# Patient Record
Sex: Male | Born: 1990 | Race: White | Hispanic: No | Marital: Single | Smoking: Never smoker
Health system: Southern US, Community
[De-identification: ages and names within clinical notes are randomized; demographics above are authoritative.]

## PROBLEM LIST (undated history)

## (undated) HISTORY — PX: HAND SURGERY: SHX662

## (undated) HISTORY — PX: TOE SURGERY: SHX1073

---
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
## 2011-05-30 ENCOUNTER — Emergency Department (HOSPITAL_BASED_OUTPATIENT_CLINIC_OR_DEPARTMENT_OTHER)
Admission: EM | Admit: 2011-05-30 | Discharge: 2011-05-30 | Disposition: A | Discharge status: Home / Self Care | Payer: Medicaid Other | Attending: Emergency Medicine | Admitting: Emergency Medicine

## 2011-05-30 ENCOUNTER — Other Ambulatory Visit: Payer: Self-pay

## 2011-05-30 ENCOUNTER — Encounter: Payer: Self-pay | Admitting: *Deleted

## 2011-05-30 ENCOUNTER — Emergency Department (INDEPENDENT_AMBULATORY_CARE_PROVIDER_SITE_OTHER): Payer: Medicaid Other

## 2011-05-30 DIAGNOSIS — R079 Chest pain, unspecified: Secondary | ICD-10-CM

## 2011-05-30 DIAGNOSIS — R0602 Shortness of breath: Secondary | ICD-10-CM | POA: Insufficient documentation

## 2011-05-30 MED ORDER — IBUPROFEN 800 MG PO TABS: 800.0000 mg | ORAL_TABLET | Freq: Three times a day (TID) | ORAL | Status: AC

## 2011-05-30 NOTE — ED Provider Notes (Signed)
History     CSN: [id number] Arrival date & time: 05/30/2011  6:49 PM  Chief Complaint  Patient presents with  . Influenza    (Consider location/radiation/quality/duration/timing/severity/associated sxs/prior treatment) HPI History provided by patient and his mother.  Pt has had diffuse body aches since yesterday afternoon.   Developed pain across top of chest w/ associated SOB and intermittent lightheadedness yesterday evening while pushing carts at work.  Symptoms lasted for approx 2 hours.  Mildly alleviated by sitting and drinking water.  Had lightheadedness this am but no CP.   No fever, cough, nasal congestion, rhinorrhea, sore throat, ear pain, abd pain, N/V/D.   Pt does not smoke cigarettes.  No FH of early MI.  No risk factors for PE.  No known sick contacts.   History reviewed. No pertinent past medical history.  Past Surgical History  Procedure Date  . Hand surgery   . Toe surgery     History reviewed. No pertinent family history.  History  Substance Use Topics  . Smoking status: Never Smoker   . Smokeless tobacco: Not on file  . Alcohol Use: No      Review of Systems  All other systems reviewed and are negative.    Allergies  Review of patient's allergies indicates no known allergies.  Home Medications   Current Outpatient Rx  Name Route Sig Dispense Refill  . IBUPROFEN 200 MG PO TABS Oral Take 600 mg by mouth once as needed. For pain       BP 123/74  Pulse 88  Temp(Src) 98.5 F (36.9 C) (Oral)  Resp 17  Ht 6\' 2"  (1.88 m)  Wt 205 lb (92.987 kg)  BMI 26.32 kg/m2  SpO2 100%  Physical Exam  Nursing note and vitals reviewed. Constitutional: He is oriented to person, place, and time. He appears well-developed and well-nourished. No distress.  HENT:  Head: Normocephalic and atraumatic.  Eyes:       Normal appearance  Neck: Normal range of motion.  Cardiovascular: Normal rate and regular rhythm.   Pulmonary/Chest: Effort normal and breath sounds  normal. He exhibits no tenderness.  Abdominal: Soft. Bowel sounds are normal. He exhibits no distension. There is no tenderness.  Musculoskeletal: He exhibits no edema and no tenderness.  Neurological: He is alert and oriented to person, place, and time.  Skin: Skin is warm and dry. No rash noted.  Psychiatric: He has a normal mood and affect. His behavior is normal.    Date: 05/31/2011  Rate: 63    Rhythm: normal sinus rhythm and sinus arrhythmia  QRS Axis: normal  Intervals: normal  ST/T Wave abnormalities: normal  Conduction Disutrbances:none  Narrative Interpretation:   Old EKG Reviewed: none available    ED Course  Procedures (including critical care time)  Labs Reviewed - No data to display Dg Chest 2 View  05/30/2011  *RADIOLOGY REPORT*  Clinical Data: Pain.  Flu-like symptoms.  Nonsmoker.  CHEST - 2 VIEW  Comparison: None.  Findings: The heart size and pulmonary vascularity are normal. The lungs appear clear and expanded without focal air space disease or consolidation. No blunting of the costophrenic angles.  No pneumothorax.  IMPRESSION: No evidence of active pulmonary disease.  Original Report Authenticated By: Marlon Pel, M.D.     1. Chest pain       MDM  Healthy Vernona Rieger presents w/ 2 hours of CP/SOB yesterday evening while pushing carts at work. No RF for ACS; no RF for or exam findings  consistent w/ PE.  Pain is not reproducible on exam.  EKG w/out signs of ischemia.  CXR neg.  Pt and his mother reassured.  Return precautions discussed.  Discharged home w/ ibuprofen.         Otilio Miu, PA 05/30/11 2335  Otilio Miu, PA 05/31/11 0004

## 2011-05-30 NOTE — ED Notes (Signed)
Pt states he has had body aches since yesterday. Some chest discomfort yesterday, none now. Runny nose.

## 2011-05-30 NOTE — ED Notes (Signed)
D/c home- no Rx given 

## 2011-06-01 NOTE — ED Provider Notes (Signed)
Medical screening examination/treatment/procedure(s) were performed by non-physician practitioner and as supervising physician I was immediately available for consultation/collaboration.   Geoffery Lyons, MD 06/01/11 1325

## 2011-08-11 ENCOUNTER — Emergency Department (HOSPITAL_BASED_OUTPATIENT_CLINIC_OR_DEPARTMENT_OTHER)
Admission: EM | Admit: 2011-08-11 | Discharge: 2011-08-12 | Disposition: A | Discharge status: Home / Self Care | Payer: Medicaid Other | Attending: Emergency Medicine | Admitting: Emergency Medicine

## 2011-08-11 ENCOUNTER — Encounter (HOSPITAL_BASED_OUTPATIENT_CLINIC_OR_DEPARTMENT_OTHER): Payer: Self-pay | Admitting: *Deleted

## 2011-08-11 DIAGNOSIS — R109 Unspecified abdominal pain: Secondary | ICD-10-CM | POA: Insufficient documentation

## 2011-08-11 DIAGNOSIS — K529 Noninfective gastroenteritis and colitis, unspecified: Secondary | ICD-10-CM

## 2011-08-11 DIAGNOSIS — K5289 Other specified noninfective gastroenteritis and colitis: Secondary | ICD-10-CM | POA: Insufficient documentation

## 2011-08-11 DIAGNOSIS — R197 Diarrhea, unspecified: Secondary | ICD-10-CM | POA: Insufficient documentation

## 2011-08-11 LAB — CBC
Hemoglobin: 14.4 g/dL (ref 13.0–17.0)
MCH: 28 pg (ref 26.0–34.0)
MCV: 80.5 fL (ref 78.0–100.0)
Platelets: 282 10*3/uL (ref 150–400)
RBC: 5.14 MIL/uL (ref 4.22–5.81)
WBC: 6.7 10*3/uL (ref 4.0–10.5)

## 2011-08-11 LAB — DIFFERENTIAL
Eosinophils Relative: 2 % (ref 0–5)
Lymphocytes Relative: 36 % (ref 12–46)
Lymphs Abs: 2.4 10*3/uL (ref 0.7–4.0)
Monocytes Relative: 9 % (ref 3–12)

## 2011-08-11 LAB — URINALYSIS, ROUTINE W REFLEX MICROSCOPIC
Bilirubin Urine: NEGATIVE
Glucose, UA: NEGATIVE mg/dL
Hgb urine dipstick: NEGATIVE
Specific Gravity, Urine: 1.022 (ref 1.005–1.030)
Urobilinogen, UA: 0.2 mg/dL (ref 0.0–1.0)
pH: 6 (ref 5.0–8.0)

## 2011-08-11 LAB — COMPREHENSIVE METABOLIC PANEL
ALT: 77 U/L — ABNORMAL HIGH (ref 0–53)
Alkaline Phosphatase: 84 U/L (ref 39–117)
BUN: 14 mg/dL (ref 6–23)
CO2: 28 mEq/L (ref 19–32)
Calcium: 9.3 mg/dL (ref 8.4–10.5)
GFR calc Af Amer: >90 mL/min (ref >90)
GFR calc non Af Amer: >90 mL/min (ref >90)
Glucose, Bld: 109 mg/dL — ABNORMAL HIGH (ref 70–99)
Potassium: 3.8 mEq/L (ref 3.5–5.1)
Sodium: 139 mEq/L (ref 135–145)

## 2011-08-11 MED ORDER — SODIUM CHLORIDE 0.9 % IV BOLUS (SEPSIS)
1000.0000 mL | Freq: Once | INTRAVENOUS | Status: AC
  Administered 2011-08-11: 1000 mL via INTRAVENOUS

## 2011-08-11 NOTE — ED Notes (Signed)
MD at bedside. 

## 2011-08-11 NOTE — ED Notes (Signed)
Pt c/o generalized abd pain with loose stools all day

## 2011-08-12 NOTE — ED Provider Notes (Signed)
History     CSN: [id number] Arrival date & time: 08/11/2011 10:22 PM   First MD Initiated Contact with Patient 08/11/11 2325      Chief Complaint  Patient presents with  . Abdominal Pain  . Diarrhea    (Consider location/radiation/quality/duration/timing/severity/associated sxs/prior treatment) Patient is a 20 y.o. male presenting with diarrhea. The history is provided by the patient.  Diarrhea The primary symptoms include abdominal pain and diarrhea. Primary symptoms do not include nausea, vomiting or dysuria. The illness began today. The onset was gradual. The problem has not changed since onset. The abdominal pain began today. The abdominal pain is generalized (But worse in the lower quadrants). The abdominal pain does not radiate. The severity of the abdominal pain is 3/10. The abdominal pain is relieved by bowel movements.  The illness does not include chills, anorexia, bloating, constipation or back pain. Risk factors: No recent bad food exposure, or travel.    History reviewed. No pertinent past medical history.  Past Surgical History  Procedure Date  . Hand surgery   . Toe surgery     History reviewed. No pertinent family history.  History  Substance Use Topics  . Smoking status: Never Smoker   . Smokeless tobacco: Not on file  . Alcohol Use: No      Review of Systems  Constitutional: Negative for chills.  Gastrointestinal: Positive for abdominal pain and diarrhea. Negative for nausea, vomiting, constipation, bloating and anorexia.  Genitourinary: Negative for dysuria.  Musculoskeletal: Negative for back pain.  All other systems reviewed and are negative.    Allergies  Review of patient's allergies indicates no known allergies.  Home Medications   Current Outpatient Rx  Name Route Sig Dispense Refill  . IBUPROFEN 200 MG PO TABS Oral Take 600 mg by mouth every 6 (six) hours as needed. For pain      BP 133/76  Pulse 92  Temp(Src) 98.7 F (37.1 C)  (Oral)  Resp 18  Ht 6\' 3"  (1.905 m)  Wt 205 lb (92.987 kg)  BMI 25.62 kg/m2  SpO2 100%  Physical Exam  Nursing note and vitals reviewed. Constitutional: He is oriented to person, place, and time. He appears well-developed and well-nourished. No distress.  HENT:  Head: Normocephalic and atraumatic.  Mouth/Throat: Oropharynx is clear and moist.  Eyes: Conjunctivae and EOM are normal. Pupils are equal, round, and reactive to light.  Neck: Normal range of motion. Neck supple.  Cardiovascular: Normal rate, regular rhythm and intact distal pulses.   No murmur heard. Pulmonary/Chest: Effort normal and breath sounds normal. No respiratory distress. He has no wheezes. He has no rales.  Abdominal: Soft. He exhibits no distension. There is tenderness in the right lower quadrant, suprapubic area and left lower quadrant. There is no rebound and no guarding.       Mild abdominal pain in the entire lower abdomen. No peritoneal signs  Musculoskeletal: Normal range of motion. He exhibits no edema and no tenderness.  Neurological: He is alert and oriented to person, place, and time.  Skin: Skin is warm and dry. No rash noted. No erythema.  Psychiatric: He has a normal mood and affect. His behavior is normal.    ED Course  Procedures (including critical care time)  Results for orders placed during the hospital encounter of 08/11/11  URINALYSIS, ROUTINE W REFLEX MICROSCOPIC      Component Value Range   Color, Urine YELLOW  YELLOW    APPearance CLEAR  CLEAR    Specific  Gravity, Urine 1.022  1.005 - 1.030    pH 6.0  5.0 - 8.0    Glucose, UA NEGATIVE  NEGATIVE (mg/dL)   Hgb urine dipstick NEGATIVE  NEGATIVE    Bilirubin Urine NEGATIVE  NEGATIVE    Ketones, ur NEGATIVE  NEGATIVE (mg/dL)   Protein, ur NEGATIVE  NEGATIVE (mg/dL)   Urobilinogen, UA 0.2  0.0 - 1.0 (mg/dL)   Nitrite NEGATIVE  NEGATIVE    Leukocytes, UA NEGATIVE  NEGATIVE   CBC      Component Value Range   WBC 6.7  4.0 - 10.5 (K/uL)     RBC 5.14  4.22 - 5.81 (MIL/uL)   Hemoglobin 14.4  13.0 - 17.0 (g/dL)   HCT 86.5  78.4 - 69.6 (%)   MCV 80.5  78.0 - 100.0 (fL)   MCH 28.0  26.0 - 34.0 (pg)   MCHC 34.8  30.0 - 36.0 (g/dL)   RDW 29.5  28.4 - 13.2 (%)   Platelets 282  150 - 400 (K/uL)  DIFFERENTIAL      Component Value Range   Neutrophils Relative 53  43 - 77 (%)   Neutro Abs 3.6  1.7 - 7.7 (K/uL)   Lymphocytes Relative 36  12 - 46 (%)   Lymphs Abs 2.4  0.7 - 4.0 (K/uL)   Monocytes Relative 9  3 - 12 (%)   Monocytes Absolute 0.6  0.1 - 1.0 (K/uL)   Eosinophils Relative 2  0 - 5 (%)   Eosinophils Absolute 0.1  0.0 - 0.7 (K/uL)   Basophils Relative 0  0 - 1 (%)   Basophils Absolute 0.0  0.0 - 0.1 (K/uL)  COMPREHENSIVE METABOLIC PANEL      Component Value Range   Sodium 139  135 - 145 (mEq/L)   Potassium 3.8  3.5 - 5.1 (mEq/L)   Chloride 100  96 - 112 (mEq/L)   CO2 28  19 - 32 (mEq/L)   Glucose, Bld 109 (*) 70 - 99 (mg/dL)   BUN 14  6 - 23 (mg/dL)   Creatinine, Ser 4.40  0.50 - 1.35 (mg/dL)   Calcium 9.3  8.4 - 10.2 (mg/dL)   Total Protein 7.6  6.0 - 8.3 (g/dL)   Albumin 4.2  3.5 - 5.2 (g/dL)   AST 30  0 - 37 (U/L)   ALT 77 (*) 0 - 53 (U/L)   Alkaline Phosphatase 84  39 - 117 (U/L)   Total Bilirubin 0.3  0.3 - 1.2 (mg/dL)   GFR calc non Af Amer >90  >90 (mL/min)   GFR calc Af Amer >90  >90 (mL/min)   No results found.    1. Enteritis       MDM   Patient with symptoms most consistent for enteritis. He denies any recent antibiotic use, travel, or bad food exposure.  He denies any vomiting or fever and on exam he has mild diffuse lower quadrant tenderness but no localized findings for appendicitis or diverticulitis. CMP, CBC, UA all within normal limits. After IV fluids he states his pain is gone and he is feeling much better. Will have him continue oral hydration and return for any worsening symptoms.      Gwyneth Sprout, MD 08/12/11 [phone]

## 2017-09-05 ENCOUNTER — Emergency Department (HOSPITAL_BASED_OUTPATIENT_CLINIC_OR_DEPARTMENT_OTHER)
Admission: EM | Admit: 2017-09-05 | Discharge: 2017-09-05 | Disposition: A | Discharge status: Home / Self Care | Payer: Self-pay | Attending: Emergency Medicine | Admitting: Emergency Medicine

## 2017-09-05 ENCOUNTER — Encounter (HOSPITAL_BASED_OUTPATIENT_CLINIC_OR_DEPARTMENT_OTHER): Payer: Self-pay | Admitting: Emergency Medicine

## 2017-09-05 ENCOUNTER — Emergency Department (HOSPITAL_BASED_OUTPATIENT_CLINIC_OR_DEPARTMENT_OTHER): Payer: Self-pay

## 2017-09-05 ENCOUNTER — Other Ambulatory Visit: Payer: Self-pay

## 2017-09-05 DIAGNOSIS — M25561 Pain in right knee: Secondary | ICD-10-CM | POA: Insufficient documentation

## 2017-09-05 DIAGNOSIS — R2241 Localized swelling, mass and lump, right lower limb: Secondary | ICD-10-CM | POA: Insufficient documentation

## 2017-09-05 MED ORDER — IBUPROFEN 400 MG PO TABS
600.0000 mg | ORAL_TABLET | Freq: Once | ORAL | Status: AC
  Administered 2017-09-05: 600 mg via ORAL
  Filled 2017-09-05: qty 1

## 2017-09-05 NOTE — ED Triage Notes (Signed)
Patient states that he thinks he dislocated his right knee yesterday - the patient is ambulatory with limp.

## 2017-09-05 NOTE — Discharge Instructions (Signed)
Please rest, ice, and elevate the knee to reduce swelling and improve pain Take Ibuprofen 600mg  three times daily for pain and inflammation Please make an appointment with orthopedics

## 2017-09-05 NOTE — ED Notes (Signed)
Saw Pt. Demonstrate crutch walking appropriately.

## 2017-09-05 NOTE — ED Provider Notes (Signed)
[facility] EMERGENCY DEPARTMENT Provider Note   CSN: [id number] Arrival date & time: 09/05/17  1742     History   Chief Complaint Chief Complaint  Patient presents with  . Knee Injury    HPI William Howe is a 27 y.o. male who presents with right knee pain. PMH significant for prior patella dislocation. He states that he has dislocated his patella in the past but this was many years ago and was due to an impact. He states he was at home and when he stood up he felt his kneecap dislocate laterally and his foot slipped due to his shoes being wet. It did go back in to place and he has been able to walk. He reports associated swelling. He has not been evaluated by ortho in the past. He works at [employer] and [employer].  HPI  History reviewed. No pertinent past medical history.  There are no active problems to display for this patient.   Past Surgical History:  Procedure Laterality Date  . HAND SURGERY    . TOE SURGERY         Home Medications    Prior to Admission medications   Medication Sig Start Date End Date Taking? Authorizing Provider  ibuprofen (ADVIL,MOTRIN) 200 MG tablet Take 600 mg by mouth every 6 (six) hours as needed. For pain    [provider]    Family History History reviewed. No pertinent family history.  Social History Social History   Tobacco Use  . Smoking status: Never Smoker  . Smokeless tobacco: Never Used  Substance Use Topics  . Alcohol use: No  . Drug use: No     Allergies   Patient has no known allergies.   Review of Systems Review of Systems  Musculoskeletal: Positive for arthralgias and joint swelling.  Skin: Negative for wound.  Neurological: Negative for weakness and numbness.  All other systems reviewed and are negative.    Physical Exam Updated Vital Signs BP 129/80 (BP Location: Left Arm)   Pulse 84   Temp 98.2 F (36.8 C) (Oral)   Resp 18   Ht 6\' 3"  (1.905 m)   Wt 93 kg (205 lb)    SpO2 99%   BMI 25.62 kg/m   Physical Exam  Constitutional: He is oriented to person, place, and time. He appears well-developed and well-nourished. No distress.  HENT:  Head: Normocephalic and atraumatic.  Eyes: Conjunctivae are normal. Pupils are equal, round, and reactive to light. Right eye exhibits no discharge. Left eye exhibits no discharge. No scleral icterus.  Neck: Normal range of motion.  Cardiovascular: Normal rate.  Pulmonary/Chest: Effort normal. No respiratory distress.  Abdominal: He exhibits no distension.  Musculoskeletal:  Right knee: Mild diffuse swelling. Patella is slightly high riding. Diffuse tenderness. Able to flex and extend knee with active ROM. N/V intact.   Neurological: He is alert and oriented to person, place, and time.  Skin: Skin is warm and dry.  Psychiatric: He has a normal mood and affect. His behavior is normal.  Nursing note and vitals reviewed.    ED Treatments / Results  Labs (all labs ordered are listed, but only abnormal results are displayed) Labs Reviewed - No data to display  EKG  EKG Interpretation None       Radiology No results found.  Procedures Procedures (including critical care time)  Medications Ordered in ED Medications - No data to display   Initial Impression / Assessment and Plan / ED Course  I have reviewed the triage vital signs and the nursing notes.  Pertinent labs & imaging results that were available during my care of the patient were reviewed by me and considered in my medical decision making (see chart for details).  27 year old male with acute right knee pain likely due to patellar dislocation. Xray shows a joint effusion without fracture or dislocation. Pt will be placed in knee immobilizer and provided crutches. RICE protocol discussed and recommended NSAIDs. Work note was given. Ortho follow up and return precautions given.  Final Clinical Impressions(s) / ED Diagnoses   Final diagnoses:    Acute pain of right knee    ED Discharge Orders    None       Bethel BornGekas, Edythe Riches Marie, PA-C 09/05/17 1843    Nira Connardama, Pedro Eduardo, MD 09/06/17 [phone]

## 2017-09-10 ENCOUNTER — Other Ambulatory Visit: Payer: Self-pay

## 2017-09-10 ENCOUNTER — Emergency Department (HOSPITAL_BASED_OUTPATIENT_CLINIC_OR_DEPARTMENT_OTHER)
Admission: EM | Admit: 2017-09-10 | Discharge: 2017-09-10 | Disposition: A | Discharge status: Home / Self Care | Payer: Self-pay | Attending: Emergency Medicine | Admitting: Emergency Medicine

## 2017-09-10 ENCOUNTER — Encounter (HOSPITAL_BASED_OUTPATIENT_CLINIC_OR_DEPARTMENT_OTHER): Payer: Self-pay | Admitting: *Deleted

## 2017-09-10 DIAGNOSIS — K122 Cellulitis and abscess of mouth: Secondary | ICD-10-CM | POA: Insufficient documentation

## 2017-09-10 DIAGNOSIS — J039 Acute tonsillitis, unspecified: Secondary | ICD-10-CM | POA: Insufficient documentation

## 2017-09-10 LAB — RAPID STREP SCREEN (MED CTR MEBANE ONLY): Streptococcus, Group A Screen (Direct): NEGATIVE

## 2017-09-10 MED ORDER — AMOXICILLIN 500 MG PO CAPS: 500.0000 mg | ORAL_CAPSULE | Freq: Three times a day (TID) | ORAL | 0 refills | Status: DC

## 2017-09-10 MED ORDER — AMOXICILLIN 500 MG PO CAPS
500.0000 mg | ORAL_CAPSULE | Freq: Once | ORAL | Status: AC
  Administered 2017-09-10: 500 mg via ORAL
  Filled 2017-09-10: qty 1

## 2017-09-10 MED ORDER — DEXAMETHASONE SODIUM PHOSPHATE 10 MG/ML IJ SOLN
10.0000 mg | Freq: Once | INTRAMUSCULAR | Status: AC
  Administered 2017-09-10: 10 mg via INTRAMUSCULAR
  Filled 2017-09-10: qty 1

## 2017-09-10 MED ORDER — KETOROLAC TROMETHAMINE 30 MG/ML IJ SOLN: 60.0000 mg | Freq: Once | INTRAMUSCULAR | Status: DC

## 2017-09-10 NOTE — ED Triage Notes (Signed)
Pt c/o sore throat x 3 days

## 2017-09-10 NOTE — ED Provider Notes (Signed)
TIME SEEN: 1:07 AM  CHIEF COMPLAINT: Sore throat  HPI: Patient is a 27 year old male with no significant past medical history who presents to the emergency department with 3 days of sore throat.  Has had subjective fevers.  No cough.  Pain with swallowing.  Has been taking Tylenol and ibuprofen and using Chloraseptic spray.  No sick contacts.  No vomiting or diarrhea.  ROS: See HPI Constitutional:  fever  Eyes: no drainage  ENT: no runny nose   Cardiovascular:  no chest pain  Resp: no SOB  GI: no vomiting GU: no dysuria Integumentary: no rash  Allergy: no hives  Musculoskeletal: no leg swelling  Neurological: no slurred speech ROS otherwise negative  PAST MEDICAL HISTORY/PAST SURGICAL HISTORY:  History reviewed. No pertinent past medical history.  MEDICATIONS:  Prior to Admission medications   Medication Sig Start Date End Date Taking? Authorizing Provider  ibuprofen (ADVIL,MOTRIN) 200 MG tablet Take 600 mg by mouth every 6 (six) hours as needed. For pain   Yes [provider]    ALLERGIES:  No Known Allergies  SOCIAL HISTORY:  Social History   Tobacco Use  . Smoking status: Never Smoker  . Smokeless tobacco: Never Used  Substance Use Topics  . Alcohol use: No    FAMILY HISTORY: History reviewed. No pertinent family history.  EXAM: BP (!) 145/100 (BP Location: Left Arm)   Pulse 89   Temp 98.9 F (37.2 C) (Oral)   Resp 18   Ht 6\' 2"  (1.88 m)   Wt 93 kg (205 lb)   SpO2 98%   BMI 26.32 kg/m  CONSTITUTIONAL: Alert and oriented and responds appropriately to questions. Well-appearing; well-nourished HEAD: Normocephalic EYES: Conjunctivae clear, pupils appear equal, EOMI ENT: normal nose; moist mucous membranes; patient has pharyngeal erythema without petechiae and bilateral tonsillar hypertrophy with exudate.  Also has swelling and redness noted to the uvula.  Hoarse voice but no muffled voice.  No uvular deviation, no unilateral swelling, no trismus or  drooling, swallowing his own secretions, no stridor, no dental caries present, no drainable dental abscess noted, no Ludwig's angina, tongue sits flat in the bottom of the mouth, no angioedema, no facial erythema or warmth, no facial swelling; no pain with movement of the neck. NECK: Supple, no meningismus, no nuchal rigidity, no LAD  CARD: RRR; S1 and S2 appreciated; no murmurs, no clicks, no rubs, no gallops RESP: Normal chest excursion without splinting or tachypnea; breath sounds clear and equal bilaterally; no wheezes, no rhonchi, no rales, no hypoxia or respiratory distress, speaking full sentences ABD/GI: Normal bowel sounds; non-distended; soft, non-tender, no rebound, no guarding, no peritoneal signs, no hepatosplenomegaly BACK:  The back appears normal and is non-tender to palpation, there is no CVA tenderness EXT: Normal ROM in all joints; non-tender to palpation; no edema; normal capillary refill; no cyanosis, no calf tenderness or swelling    SKIN: Normal color for age and race; warm; no rash NEURO: Moves all extremities equally PSYCH: The patient's mood and manner are appropriate. Grooming and personal hygiene are appropriate.  MEDICAL DECISION MAKING: Patient here with tonsillitis and uvulitis.  Not in any significant distress.  Afebrile and nontoxic.  Does appear well-hydrated.  Strep test is negative but we will treat him with antibiotics given I am concerned for tonsillitis and uvulitis with bacterial source.  Will discharge on amoxicillin.  Given Decadron for symptomatic relief.  Recommended continuing Tylenol, ibuprofen and Chloraseptic spray.  Recommended warm salt water gargles.  Discussed return precautions.  Patient and family comfortable with this plan.  Provided with work note.  At this time, I do not feel there is any life-threatening condition present. I have reviewed and discussed all results (EKG, imaging, lab, urine as appropriate) and exam findings with patient/family. I  have reviewed nursing notes and appropriate previous records.  I feel the patient is safe to be discharged home without further emergent workup and can continue workup as an outpatient as needed. Discussed usual and customary return precautions. Patient/family verbalize understanding and are comfortable with this plan.  Outpatient follow-up has been provided if needed. All questions have been answered.     Kyrielle Urbanski, Layla MawKristen N, DO 09/10/17 [phone]

## 2017-09-10 NOTE — ED Notes (Signed)
Pt verbalizes understanding of d/c instructions and denies any further need at this time. 

## 2017-09-10 NOTE — Discharge Instructions (Signed)
You may alternate Tylenol 1000 mg every 6 hours as needed for pain and Ibuprofen 800 mg every 8 hours as needed for pain.  Please take Ibuprofen with food.  You may alternate between these medications for fever and pain.  You may use over-the-counter Chloraseptic spray and throat lozenges for comfort.  Please gargle with warm salt water several times a day as this may help your pain.

## 2017-09-11 LAB — CULTURE, GROUP A STREP (THRC)

## 2017-09-12 ENCOUNTER — Telehealth (HOSPITAL_BASED_OUTPATIENT_CLINIC_OR_DEPARTMENT_OTHER): Payer: Self-pay | Admitting: Emergency Medicine

## 2017-09-12 ENCOUNTER — Telehealth: Payer: Self-pay | Admitting: Emergency Medicine

## 2017-09-12 NOTE — Telephone Encounter (Signed)
Post ED Visit - Positive Culture Follow-up  Culture report reviewed by antimicrobial stewardship pharmacist:  [x]  Enzo BiNathan Batchelder, Pharm.D. []  Celedonio MiyamotoJeremy Frens, [address]Ph[address]D., BCPS AQ-ID []  Garvin FilaMike Maccia, Pharm.D., BCPS []  Georgina PillionElizabeth Martin, Pharm.D., BCPS []  Penns CreekMinh Pham, [address]Ph[address]D., BCPS, AAHIVP []  Estella HuskMichelle Turner, Pharm.D., BCPS, AAHIVP []  Lysle Pearlachel Rumbarger, PharmD, BCPS []  Blake DivineShannon Parkey, PharmD []  Pollyann SamplesAndy Johnston, PharmD, BCPS  Positive strep culture Treated with amoxicillin, organism sensitive to the same and no further patient follow-up is required at this time.  Berle MullMiller, Tene Gato 09/12/2017, 2:31 PM

## 2019-10-10 IMAGING — CR DG KNEE COMPLETE 4+V*R*
4 series · 4 of 4 positions shown · non-contrast
Comparison: 05/10/2009

CLINICAL DATA: Status post injury to right knee

EXAM:
RIGHT KNEE - COMPLETE 4+ VIEW

[t knee lat right *]
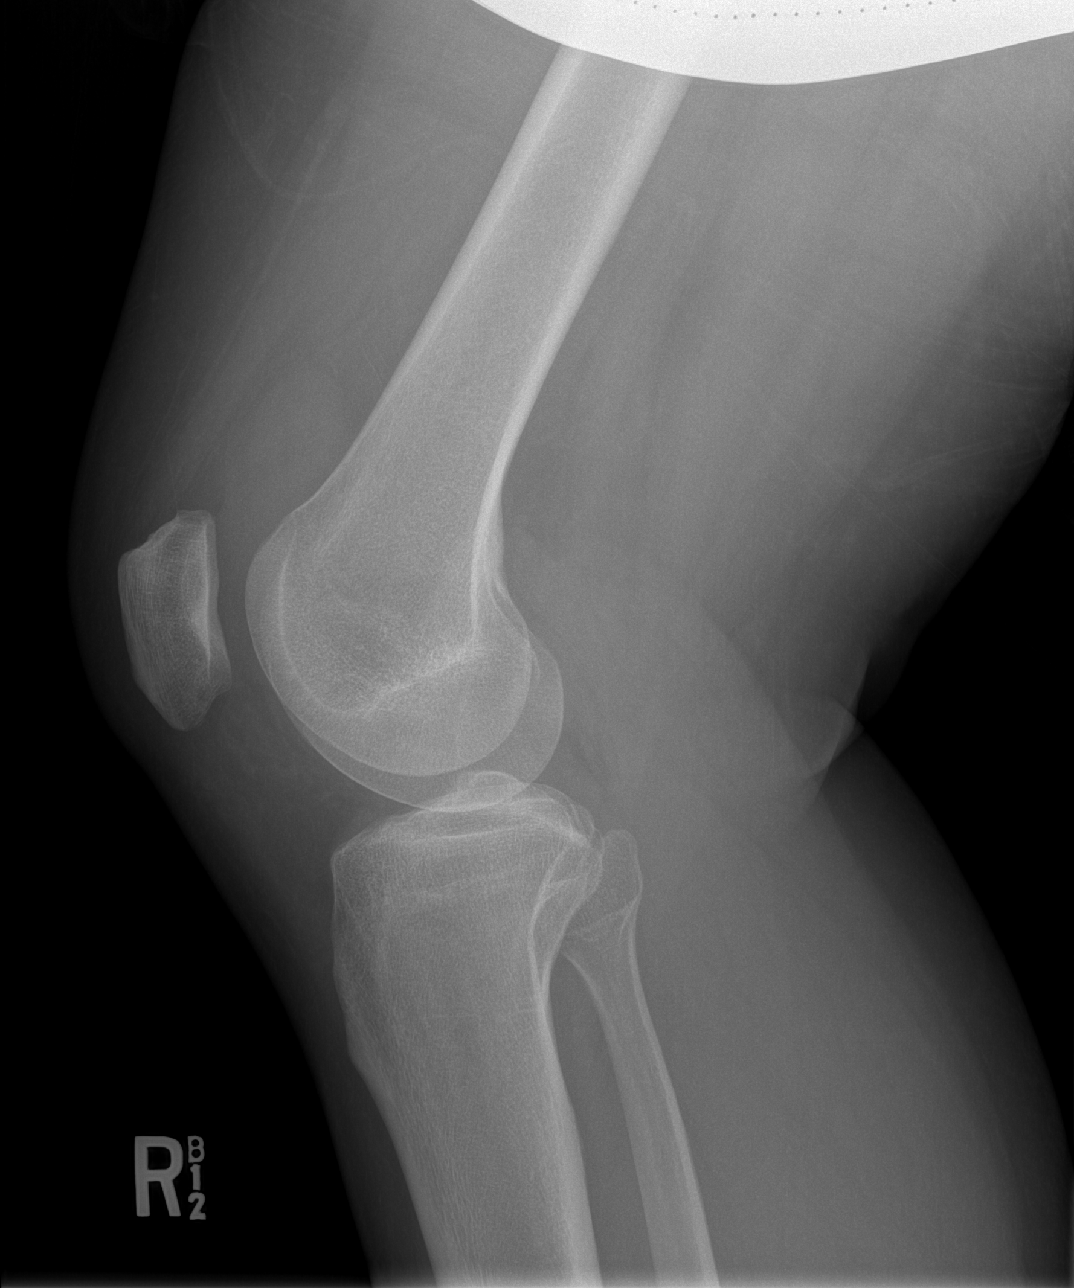

[t knee ap right]
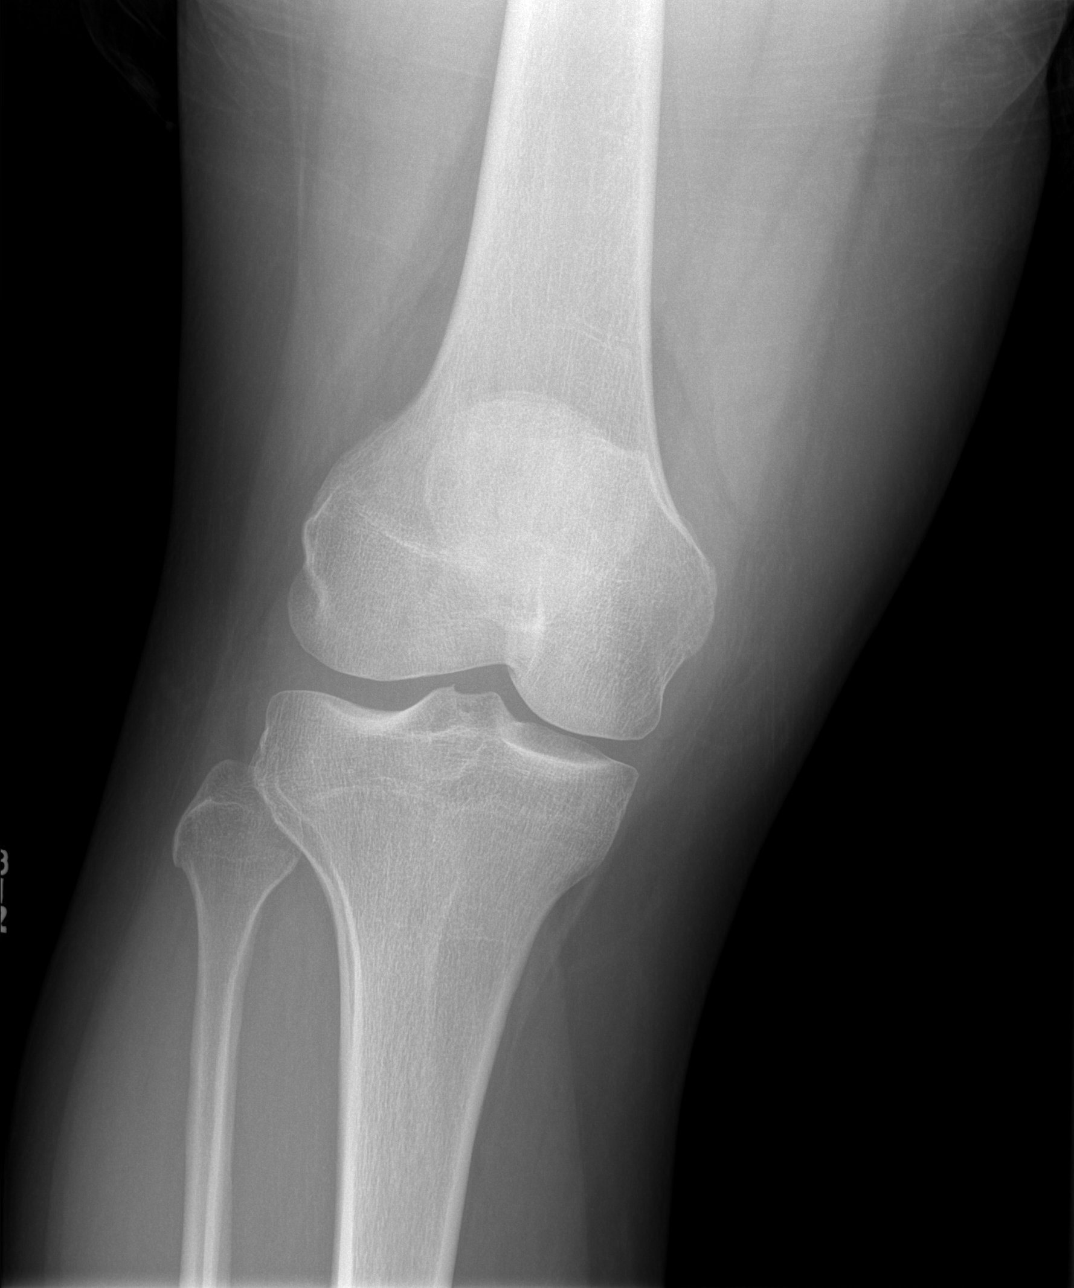

[t knee oblique right (1 of 2)]
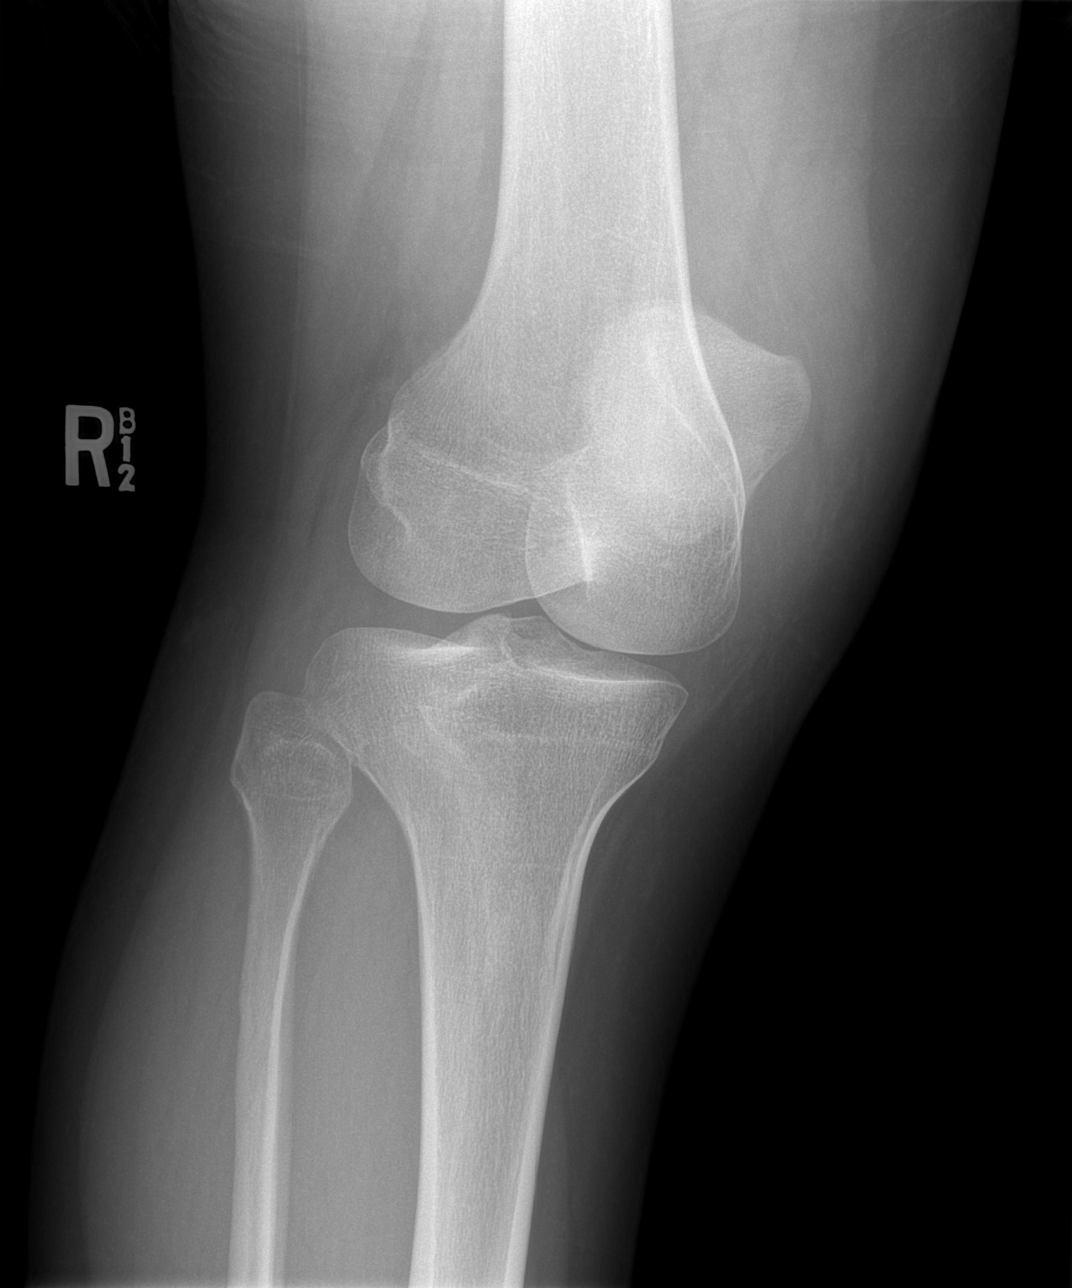

[t knee oblique right (2 of 2)]
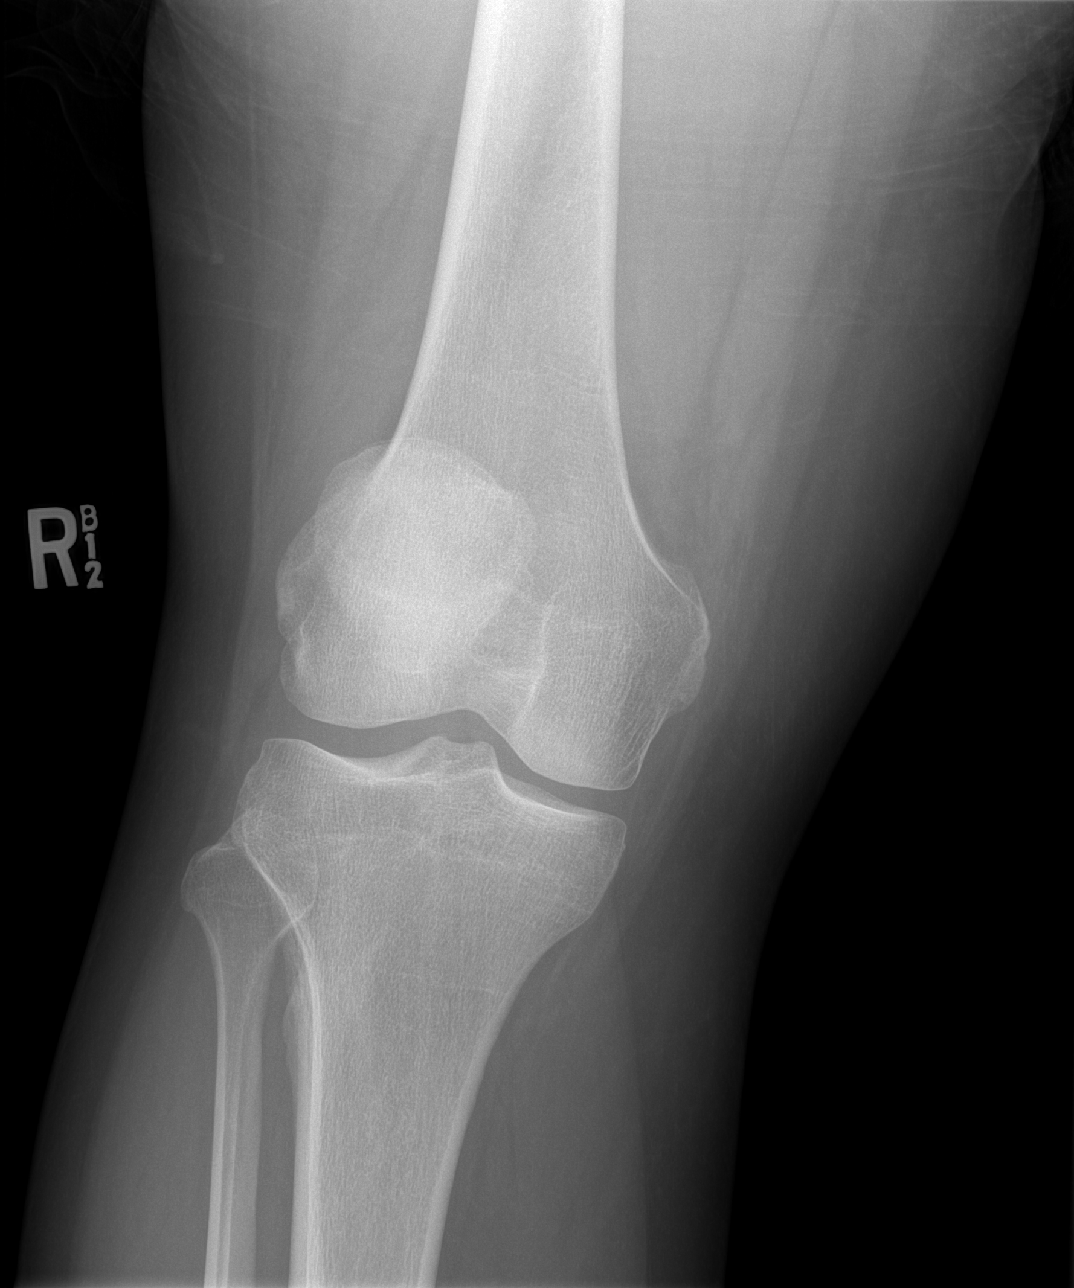

[4 of 4 positions shown; findings below may reference images not displayed]

FINDINGS: Small suprapatellar joint effusion. No fracture or dislocation. No
radio opaque foreign body or soft tissue calcification.
IMPRESSION: 1. Joint effusion.
2. No acute bone abnormality.

## 2020-06-12 ENCOUNTER — Other Ambulatory Visit: Payer: Self-pay

## 2020-06-12 ENCOUNTER — Encounter (HOSPITAL_BASED_OUTPATIENT_CLINIC_OR_DEPARTMENT_OTHER): Payer: Self-pay | Admitting: Emergency Medicine

## 2020-06-12 ENCOUNTER — Emergency Department (HOSPITAL_BASED_OUTPATIENT_CLINIC_OR_DEPARTMENT_OTHER)
Admission: EM | Admit: 2020-06-12 | Discharge: 2020-06-12 | Disposition: A | Discharge status: Home / Self Care | Payer: Worker's Compensation | Attending: Emergency Medicine | Admitting: Emergency Medicine

## 2020-06-12 DIAGNOSIS — R202 Paresthesia of skin: Secondary | ICD-10-CM | POA: Insufficient documentation

## 2020-06-12 DIAGNOSIS — M545 Low back pain, unspecified: Secondary | ICD-10-CM | POA: Diagnosis present

## 2020-06-12 DIAGNOSIS — M5441 Lumbago with sciatica, right side: Secondary | ICD-10-CM | POA: Insufficient documentation

## 2020-06-12 MED ORDER — PREDNISONE 10 MG (21) PO TBPK: ORAL_TABLET | Freq: Every day | ORAL | 0 refills | Status: DC

## 2020-06-12 MED ORDER — KETOROLAC TROMETHAMINE 30 MG/ML IJ SOLN
60.0000 mg | Freq: Once | INTRAMUSCULAR | Status: AC
  Administered 2020-06-12: 60 mg via INTRAMUSCULAR
  Filled 2020-06-12: qty 2

## 2020-06-12 MED ORDER — METHOCARBAMOL 500 MG PO TABS: 500.0000 mg | ORAL_TABLET | Freq: Two times a day (BID) | ORAL | 0 refills | Status: DC

## 2020-06-12 MED ORDER — NAPROXEN 500 MG PO TABS: 500.0000 mg | ORAL_TABLET | Freq: Two times a day (BID) | ORAL | 0 refills | Status: DC

## 2020-06-12 NOTE — ED Notes (Signed)
Taking cyclobenzaprine 10MG  BID and ibuprofen without relief from pain.

## 2020-06-12 NOTE — ED Provider Notes (Signed)
[facility] EMERGENCY DEPARTMENT Provider Note   CSN: [id number] Arrival date & time: 06/12/20  1928     History Chief Complaint  Patient presents with  . Back Pain    William Howe is a 29 y.o. male who presents to the ED today with complaint of sudden onset, constant, worsening, achy, right lower back pain x 4 days. 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 Pt reports he left work that night and went to [facility] the next morning. He was given a "steroid shot" and prescribed flexeril. Pt reports that his pain has been worsening since then and this morning he woke up so stiff that his brother had to help him get out of the bed. Pain is exacerbated with bending and twisting. Pt reports that if he moves a certain way he has a tingling sensation shoot down his leg. He denies any hx of back surgeries. No hx of IVDA. No hx prolonged steroid use. Denies fevers, chills, urinary retention, urinary or bowel incontinence, saddle anesthesia, or any other associated symptoms.   The history is provided by the patient and medical records.       History reviewed. No pertinent past medical history.  There are no problems to display for this patient.   Past Surgical History:  Procedure Laterality Date  . HAND SURGERY    . TOE SURGERY         No family history on file.  Social History   Tobacco Use  . Smoking status: Never Smoker  . Smokeless tobacco: Never Used  Vaping Use  . Vaping Use: Never used  Substance Use Topics  . Alcohol use: No  . Drug use: No    Home Medications Prior to Admission medications   Medication Sig Start Date End Date Taking? Authorizing Provider  amoxicillin (AMOXIL) 500 MG capsule Take 1 capsule (500 mg total) by mouth 3 (three) times daily. 09/10/17   Ward, Layla Maw, DO  ibuprofen (ADVIL,MOTRIN) 200 MG tablet Take 600 mg by mouth every 6 (six) hours as needed. For pain    [provider]   methocarbamol (ROBAXIN) 500 MG tablet Take 1 tablet (500 mg total) by mouth 2 (two) times daily. 06/12/20   Hyman Hopes, Mavryk Pino, PA-C  naproxen (NAPROSYN) 500 MG tablet Take 1 tablet (500 mg total) by mouth 2 (two) times daily. 06/12/20   Shanyn Preisler, PA-C  predniSONE (STERAPRED UNI-PAK 21 TAB) 10 MG (21) TBPK tablet Take by mouth daily. Take 6 tabs by mouth daily  for 2 days, then 5 tabs for 2 days, then 4 tabs for 2 days, then 3 tabs for 2 days, 2 tabs for 2 days, then 1 tab by mouth daily for 2 days 06/12/20   Tanda Rockers, PA-C    Allergies    Patient has no known allergies.  Review of Systems   Review of Systems  Genitourinary: Negative for difficulty urinating.  Musculoskeletal: Positive for back pain.  Neurological: Negative for weakness and numbness.  All other systems reviewed and are negative.   Physical Exam Updated Vital Signs BP 130/77 (BP Location: Right Arm)   Pulse 83   Temp 98.5 F (36.9 C) (Oral)   Resp 14   Ht 6\' 2"  (1.88 m)   Wt 93 kg   SpO2 99%   BMI 26.32 kg/m   Physical Exam Vitals and nursing note reviewed.  Constitutional:      Appearance: He is obese.  He is not ill-appearing.  HENT:     Head: Normocephalic and atraumatic.  Eyes:     Conjunctiva/sclera: Conjunctivae normal.  Cardiovascular:     Rate and Rhythm: Normal rate and regular rhythm.     Pulses: Normal pulses.  Pulmonary:     Effort: Pulmonary effort is normal.     Breath sounds: Normal breath sounds. No wheezing, rhonchi or rales.  Abdominal:     Palpations: Abdomen is soft.     Tenderness: There is no abdominal tenderness.  Musculoskeletal:     Cervical back: Neck supple.     Comments: No C, T, or L midline spinal TTP. + bilateral lower parathoracic/upper paralumbar musculature TTP (R > L). ROM intact to neck and back. Strength 5/5 to BUE and BLEs. Sensation intact throughout. + SLR on right.   Skin:    General: Skin is warm and dry.  Neurological:     Mental Status: He is  alert.     ED Results / Procedures / Treatments   Labs (all labs ordered are listed, but only abnormal results are displayed) Labs Reviewed - No data to display  EKG None  Radiology No results found.  Procedures Procedures (including critical care time)  Medications Ordered in ED Medications  ketorolac (TORADOL) 30 MG/ML injection 60 mg (60 mg Intramuscular Given 06/12/20 2035)    ED Course  I have reviewed the triage vital signs and the nursing notes.  Pertinent labs & imaging results that were available during my care of the patient were reviewed by me and considered in my medical decision making (see chart for details).    MDM Rules/Calculators/A&P                          29 year old male presents to the ED today with complaint of sudden onset back pain that began a couple days ago while stocking the vending machine at work.  Was seen at urgent care yesterday and treated with a steroid injection as well as muscle relaxers at home without relief.  Reports pain is worsened with movement of any kind and that specific movements will cause a sharp tingling sensation down his right lower extremity.  On arrival to the ED patient is afebrile, nontachycardic nontachypneic.  He appears to be in no acute distress.  He has no midline spinal tenderness on exam today.  His tenderness appears to be on the parathoracic and paralumbar musculature however right greater than left.  He has positive straight leg raise on right.  He is neurovascularly intact.  He has no red flag symptoms today concerning for cauda equina, spinal epidural abscess, AAA.  Plan to provide additional Toradol injection in the ED today.  We will plan to discharge home with different muscle relaxer, was on Flexeril, will change to Robaxin.  Will provide naproxen, steroid taper.  Instructed to pick up Salonpas over-the-counter as this is 4.5% lidocaine and will help as well.  Have given information for sports medicine for  further evaluation.  Given work note for the next week.  Strict return precautions have been discussed with patient.  He is in agreement with plan and stable for discharge.  This note was prepared using Dragon voice recognition software and may include unintentional dictation errors due to the inherent limitations of voice recognition software.  Final Clinical Impression(s) / ED Diagnoses Final diagnoses:  Acute right-sided low back pain with right-sided sciatica    Rx / DC Orders  ED Discharge Orders         Ordered    predniSONE (STERAPRED UNI-PAK 21 TAB) 10 MG (21) TBPK tablet  Daily        06/12/20 2031    methocarbamol (ROBAXIN) 500 MG tablet  2 times daily        06/12/20 2031    naproxen (NAPROSYN) 500 MG tablet  2 times daily        06/12/20 2031           Discharge Instructions     Please pick up medication and take as prescribed. I would recommend picking up Salon Pas patches OTC as these have 4.5% lidocaine and are more cost effective than prescription strength 5% lidocaine patches.   While at home you can apply heat for symptomatic relief. I would recommend stretching from time to time as laying flat on your back for several hours can exacerbate symptoms.   Follow up with [facility] for primary care needs. They take pt's without insurance. You can also follow up with Dr. Jordan Likes sports medicine however he will likely need a copay.   Return to the ED IMMEDIATELY for any worsening symptoms including severe worsening pain, inability to ambulate, numbness in your groin, holding onto urine, peeing or pooping on yourself, or any other new/concerning symptoms.        Tanda Rockers, PA-C 06/12/20 2043    Gwyneth Sprout, MD 06/12/20 2238

## 2020-06-12 NOTE — Discharge Instructions (Signed)
Please pick up medication and take as prescribed. I would recommend picking up Salon Pas patches OTC as these have 4.5% lidocaine and are more cost effective than prescription strength 5% lidocaine patches.   While at home you can apply heat for symptomatic relief. I would recommend stretching from time to time as laying flat on your back for several hours can exacerbate symptoms.   Follow up with [facility] for primary care needs. They take pt's without insurance. You can also follow up with Dr. Jordan Likes sports medicine however he will likely need a copay.   Return to the ED IMMEDIATELY for any worsening symptoms including severe worsening pain, inability to ambulate, numbness in your groin, holding onto urine, peeing or pooping on yourself, or any other new/concerning symptoms.

## 2020-06-12 NOTE — ED Triage Notes (Signed)
Patient arrived via POV c/o back pain x 4 days. Patient previously seen at [facility] for same. Given steroid shot and muscle relaxer. Patient states pain is progressively worse over last 2 days, now having difficultly bending over and walking. Patient is AO x 4, VS WDL, shuffling gait.

## 2020-07-03 ENCOUNTER — Telehealth: Payer: Self-pay | Admitting: Family Medicine

## 2020-07-03 NOTE — Telephone Encounter (Signed)
Called pt to offer ED follow-up appt--no answer, message left.--glh

## 2022-12-08 ENCOUNTER — Encounter: Payer: Self-pay | Admitting: *Deleted

## 2023-10-08 ENCOUNTER — Emergency Department (HOSPITAL_BASED_OUTPATIENT_CLINIC_OR_DEPARTMENT_OTHER): Payer: Medicaid Other

## 2023-10-08 ENCOUNTER — Emergency Department (HOSPITAL_BASED_OUTPATIENT_CLINIC_OR_DEPARTMENT_OTHER)
Admission: EM | Admit: 2023-10-08 | Discharge: 2023-10-08 | Disposition: A | Discharge status: Home / Self Care | Payer: Medicaid Other | Attending: Emergency Medicine | Admitting: Emergency Medicine

## 2023-10-08 ENCOUNTER — Encounter (HOSPITAL_BASED_OUTPATIENT_CLINIC_OR_DEPARTMENT_OTHER): Payer: Self-pay | Admitting: Emergency Medicine

## 2023-10-08 ENCOUNTER — Other Ambulatory Visit: Payer: Self-pay

## 2023-10-08 DIAGNOSIS — M542 Cervicalgia: Secondary | ICD-10-CM | POA: Diagnosis present

## 2023-10-08 DIAGNOSIS — M25512 Pain in left shoulder: Secondary | ICD-10-CM | POA: Insufficient documentation

## 2023-10-08 DIAGNOSIS — M5412 Radiculopathy, cervical region: Secondary | ICD-10-CM | POA: Insufficient documentation

## 2023-10-08 LAB — COMPREHENSIVE METABOLIC PANEL
ALT: 66 U/L — ABNORMAL HIGH (ref 0–44)
AST: 29 U/L (ref 15–41)
Albumin: 4.5 g/dL (ref 3.5–5.0)
Alkaline Phosphatase: 75 U/L (ref 38–126)
Anion gap: 9 (ref 5–15)
BUN: 19 mg/dL (ref 6–20)
CO2: 26 mmol/L (ref 22–32)
Calcium: 9 mg/dL (ref 8.9–10.3)
Chloride: 102 mmol/L (ref 98–111)
Creatinine, Ser: 0.76 mg/dL (ref 0.61–1.24)
GFR, Estimated: >60 mL/min (ref >60)
Glucose, Bld: 109 mg/dL — ABNORMAL HIGH (ref 70–99)
Potassium: 3.7 mmol/L (ref 3.5–5.1)
Sodium: 137 mmol/L (ref 135–145)
Total Bilirubin: 0.7 mg/dL (ref 0.0–1.2)
Total Protein: 8.1 g/dL (ref 6.5–8.1)

## 2023-10-08 LAB — CBC WITH DIFFERENTIAL/PLATELET
Abs Immature Granulocytes: 0.03 10*3/uL (ref 0.00–0.07)
Basophils Absolute: 0.1 10*3/uL (ref 0.0–0.1)
Basophils Relative: 1 %
Eosinophils Absolute: 0.4 10*3/uL (ref 0.0–0.5)
Eosinophils Relative: 4 %
HCT: 42 % (ref 39.0–52.0)
Hemoglobin: 14.2 g/dL (ref 13.0–17.0)
Immature Granulocytes: 0 %
Lymphocytes Relative: 21 %
Lymphs Abs: 2 10*3/uL (ref 0.7–4.0)
MCH: 27.7 pg (ref 26.0–34.0)
MCHC: 33.8 g/dL (ref 30.0–36.0)
MCV: 82 fL (ref 80.0–100.0)
Monocytes Absolute: 0.8 10*3/uL (ref 0.1–1.0)
Monocytes Relative: 8 %
Neutro Abs: 6.2 10*3/uL (ref 1.7–7.7)
Neutrophils Relative %: 66 %
Platelets: 344 10*3/uL (ref 150–400)
RBC: 5.12 MIL/uL (ref 4.22–5.81)
RDW: 14.1 % (ref 11.5–15.5)
WBC: 9.4 10*3/uL (ref 4.0–10.5)
nRBC: 0 % (ref 0.0–0.2)

## 2023-10-08 LAB — TROPONIN I (HIGH SENSITIVITY)
Troponin I (High Sensitivity): 3 ng/L (ref <18)
Troponin I (High Sensitivity): 3 ng/L (ref <18)

## 2023-10-08 LAB — D-DIMER, QUANTITATIVE: D-Dimer, Quant: 0.31 ug{FEU}/mL (ref 0.00–0.50)

## 2023-10-08 MED ORDER — METHOCARBAMOL 500 MG PO TABS: 500.0000 mg | ORAL_TABLET | Freq: Three times a day (TID) | ORAL | 0 refills | Status: DC | PRN

## 2023-10-08 MED ORDER — LIDOCAINE 5 % EX PTCH: 1.0000 | MEDICATED_PATCH | CUTANEOUS | 0 refills | Status: AC

## 2023-10-08 MED ORDER — DEXAMETHASONE SODIUM PHOSPHATE 10 MG/ML IJ SOLN
10.0000 mg | Freq: Once | INTRAMUSCULAR | Status: AC
Start: 2023-10-08 — End: 2023-10-08
  Administered 2023-10-08: 10 mg via INTRAVENOUS
  Filled 2023-10-08: qty 1

## 2023-10-08 MED ORDER — LIDOCAINE 5 % EX PTCH
1.0000 | MEDICATED_PATCH | CUTANEOUS | Status: DC
  Administered 2023-10-08: 1 via TRANSDERMAL
  Filled 2023-10-08: qty 1

## 2023-10-08 MED ORDER — KETOROLAC TROMETHAMINE 30 MG/ML IJ SOLN
15.0000 mg | Freq: Once | INTRAMUSCULAR | Status: AC
  Administered 2023-10-08: 15 mg via INTRAVENOUS
  Filled 2023-10-08: qty 1

## 2023-10-08 MED ORDER — METHYLPREDNISOLONE 4 MG PO TBPK: ORAL_TABLET | ORAL | 0 refills | Status: DC

## 2023-10-08 MED ORDER — DIAZEPAM 2 MG PO TABS
2.0000 mg | ORAL_TABLET | Freq: Once | ORAL | Status: AC
  Administered 2023-10-08: 2 mg via ORAL
  Filled 2023-10-08: qty 1

## 2023-10-08 NOTE — ED Triage Notes (Signed)
 Left arm pain X 2 weeks gradual increase in pain until today, sever pain, some positioners radiate pain to hands and/or neck and chest if breathes to deep.

## 2023-10-08 NOTE — ED Provider Notes (Signed)
 [place] EMERGENCY DEPARTMENT AT [facility] Provider Note   CSN: [id number] Arrival date & time: 10/08/23  0315     History  Chief Complaint  Patient presents with   Shoulder Pain    Leonte Horrigan is a 33 y.o. male.  Patient presents with severe left shoulder and arm pain for the past 2 weeks but worse tonight.  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  He has been dealing with the pain with ibuprofen and heat.  The pain seems to worsen every time he goes to work.  Has had a dull ache to his left shoulder at the start of his shift today at 3 PM but after his shift the pain became much more severe and unable to tolerate it at home.  Pain starts to his left lateral shoulder upper back and neck area and radiates down his entire left arm.  No weakness, numbness or tingling.  Sometimes hurts his chest when he tries to breathe deep but does not hurt his chest otherwise.  No cough or fever he did just get over bronchitis a couple weeks ago.  No abdominal pain, nausea, vomiting, headache.  Shoulder feels better if elbow is flexed and shoulders held against body.  He is right-handed.  Denies any specific injury.  He states he has had pain pretty much on a daily basis that worsens when he goes to work.  It has never been this severe however  The history is provided by the patient and a parent.  Shoulder Pain Associated symptoms: no fever        Home Medications Prior to Admission medications   Medication Sig Start Date End Date Taking? Authorizing Provider  amoxicillin (AMOXIL) 500 MG capsule Take 1 capsule (500 mg total) by mouth 3 (three) times daily. 09/10/17   Ward, Layla Maw, DO  ibuprofen (ADVIL,MOTRIN) 200 MG tablet Take 600 mg by mouth every 6 (six) hours as needed. For pain    [provider]  methocarbamol (ROBAXIN) 500 MG tablet Take 1 tablet (500 mg total) by mouth 2 (two) times daily. 06/12/20   Hyman Hopes,  Margaux, PA-C  naproxen (NAPROSYN) 500 MG tablet Take 1 tablet (500 mg total) by mouth 2 (two) times daily. 06/12/20   Venter, Margaux, PA-C  predniSONE (STERAPRED UNI-PAK 21 TAB) 10 MG (21) TBPK tablet Take by mouth daily. Take 6 tabs by mouth daily  for 2 days, then 5 tabs for 2 days, then 4 tabs for 2 days, then 3 tabs for 2 days, 2 tabs for 2 days, then 1 tab by mouth daily for 2 days 06/12/20   Tanda Rockers, PA-C      Allergies    Patient has no known allergies.    Review of Systems   Review of Systems  Constitutional:  Negative for activity change, appetite change and fever.  HENT:  Negative for congestion and rhinorrhea.   Respiratory:  Negative for cough, chest tightness and shortness of breath.   Cardiovascular:  Negative for chest pain.  Gastrointestinal:  Negative for abdominal pain, nausea and vomiting.  Genitourinary:  Negative for dysuria and hematuria.  Musculoskeletal:  Positive for arthralgias and myalgias.  Skin:  Negative for rash.  Neurological:  Negative for dizziness, weakness and headaches.   all other systems are negative except as noted in the HPI and PMH.    Physical Exam Updated Vital Signs BP (!) 153/101 (BP Location:  Right Arm)   Pulse (!) 102   Temp 98.6 F (37 C) (Oral)   Resp 19   Ht 6\' 2"  (1.88 m)   Wt 117.9 kg   SpO2 100%   BMI 33.38 kg/m  Physical Exam Vitals and nursing note reviewed.  Constitutional:      General: He is not in acute distress.    Appearance: He is well-developed.     Comments: Anxious appearing  HENT:     Head: Normocephalic and atraumatic.     Mouth/Throat:     Pharynx: No oropharyngeal exudate.  Eyes:     Conjunctiva/sclera: Conjunctivae normal.     Pupils: Pupils are equal, round, and reactive to light.  Neck:     Comments: No meningismus. Cardiovascular:     Rate and Rhythm: Normal rate and regular rhythm.     Heart sounds: Normal heart sounds. No murmur heard. Pulmonary:     Effort: Pulmonary effort is  normal. No respiratory distress.     Breath sounds: Normal breath sounds.  Abdominal:     Palpations: Abdomen is soft.     Tenderness: There is no abdominal tenderness. There is no guarding or rebound.  Musculoskeletal:        General: Tenderness present.     Cervical back: Normal range of motion and neck supple.     Comments: Tenderness of left upper back, trapezius, lateral shoulder.  Arm held in flexed position.  Intact radial pulse and cardinal hand movements.  Pain with active and passive range of motion of shoulder and neck  +2 radial pulse, grip strength normal  Skin:    General: Skin is warm.  Neurological:     Mental Status: He is alert and oriented to person, place, and time.     Cranial Nerves: No cranial nerve deficit.     Motor: No abnormal muscle tone.     Coordination: Coordination normal.     Comments:  5/5 strength throughout. CN 2-12 intact.Equal grip strength.   Psychiatric:        Behavior: Behavior normal.     ED Results / Procedures / Treatments   Labs (all labs ordered are listed, but only abnormal results are displayed) Labs Reviewed  COMPREHENSIVE METABOLIC PANEL - Abnormal; Notable for the following components:      Result Value   Glucose, Bld 109 (*)    ALT 66 (*)    All other components within normal limits  CBC WITH DIFFERENTIAL/PLATELET  D-DIMER, QUANTITATIVE (NOT AT [facility])  TROPONIN I (HIGH SENSITIVITY)  TROPONIN I (HIGH SENSITIVITY)    EKG EKG Interpretation Date/Time:  Saturday October 08 2023 03:26:31 EST Ventricular Rate:  95 PR Interval:  158 QRS Duration:  97 QT Interval:  375 QTC Calculation: 472 R Axis:   67  Text Interpretation: Sinus rhythm Borderline prolonged QT interval No significant change was found Confirmed by Glynn Octave [phone]) on 10/08/2023 3:37:21 AM  Radiology DG Chest 2 View Result Date: 10/08/2023 CLINICAL DATA:  Chest pain EXAM: CHEST - 2 VIEW COMPARISON:  05/30/2011 FINDINGS: Artifact from EKG leads.  Normal heart size and mediastinal contours. No acute infiltrate or edema. No effusion or pneumothorax. No acute osseous findings. IMPRESSION: No active cardiopulmonary disease. Electronically Signed   By: Tiburcio Pea M.D.   On: 10/08/2023 04:28   DG Shoulder Left Result Date: 10/08/2023 CLINICAL DATA:  Left arm pain for 2 weeks with gradual increase EXAM: LEFT SHOULDER - 3 VIEW COMPARISON:  None Available. FINDINGS: There  is no evidence of fracture or dislocation. There is no evidence of arthropathy or other focal bone abnormality. Soft tissues are unremarkable. IMPRESSION: Negative. Electronically Signed   By: Tiburcio Pea M.D.   On: 10/08/2023 04:28   CT Cervical Spine Wo Contrast Result Date: 10/08/2023 CLINICAL DATA:  Cervical radiculopathy without red flag symptoms. Left arm pain for 2 weeks that has progressed, now severe EXAM: CT CERVICAL SPINE WITHOUT CONTRAST TECHNIQUE: Multidetector CT imaging of the cervical spine was performed without intravenous contrast. Multiplanar CT image reconstructions were also generated. RADIATION DOSE REDUCTION: This exam was performed according to the departmental dose-optimization program which includes automated exposure control, adjustment of the mA and/or kV according to patient size and/or use of iterative reconstruction technique. COMPARISON:  None Available. FINDINGS: Alignment: Straightening of cervical lordosis.  No listhesis. Skull base and vertebrae: No acute fracture. No primary bone lesion or focal pathologic process. There is prevertebral calcification at C1-2 without visible adjacent soft tissue swelling, more consistent with osteophytes than muscular calcification given bone attachment in midline position. Soft tissues and spinal canal: No prevertebral fluid or swelling. No visible canal hematoma. Disc levels: There is a 6 mm calcification at the upper left foramen of C5-6 without clear bony attachment site on sagittal reformats to suggest bone  mass. No corticomedullary differentiation typical of ossicle. Crystalline arthropathy is considered but no underlying facet degeneration or erosion is seen. Calcified disc herniation is considered but this would be a far lateral location in the underlying disc space is not collapse or notably degenerated. The epicenter is not along the dura typical of a calcifying mass. MRI follow-up may be contributory. Upper chest: Negative IMPRESSION: Calcification crowding the upper left C6-7 foramen, correlate for left C7 neuropathy and suggest referral for cervical MRI. No certain underlying cause, further discussion above. Electronically Signed   By: Tiburcio Pea M.D.   On: 10/08/2023 04:27    Procedures Procedures    Medications Ordered in ED Medications  ketorolac (TORADOL) 30 MG/ML injection 15 mg (has no administration in time range)  lidocaine (LIDODERM) 5 % 1 patch (has no administration in time range)  diazepam (VALIUM) tablet 2 mg (has no administration in time range)    ED Course/ Medical Decision Making/ A&P                                 Medical Decision Making Amount and/or Complexity of Data Reviewed Independent Historian: parent Labs: ordered. Decision-making details documented in ED Course. Radiology: ordered and independent interpretation performed. Decision-making details documented in ED Course. ECG/medicine tests: ordered and independent interpretation performed. Decision-making details documented in ED Course.  Risk Prescription drug management.   2 weeks of left shoulder and arm pain worse with movement and manipulation.  Vitals stable, no distress.  EKG is sinus rhythm without acute ST changes.  Low suspicion for ACS.  Favor likely cervical radiculopathy.  Troponin and D-dimer negative.  Low suspicion for ACS or PE. Given pain control and muscle relaxers.  Will obtain imaging to evaluate for cervical radiculopathy.  Chest x-ray left shoulder x-ray negative for  acute pathology.  Results reviewed and interpreted by me. CT C-spine shows calcific density at C6-C7 likely responsible for patient's radicular symptoms.  Troponin negative x 2.  Low suspicion for ACS.  Patient feels much improved after treatment in the ED with muscle relaxers, anti-inflammatories and pain control.  Range of motion of his arm  is improved and his strength is normal.  No paresthesias or reduced sensation.  Discussed follow-up with neurosurgery for MRI as well as further evaluation of his cervical radiculopathy.  Return to the ED with worsening pain, weakness, numbness, tingling, bowel or bladder incontinence or other concerns        Final Clinical Impression(s) / ED Diagnoses Final diagnoses:  Cervical radiculopathy    Rx / DC Orders ED Discharge Orders     None         Juliauna Stueve, Jeannett Senior, MD 10/08/23 [phone]

## 2023-10-08 NOTE — Discharge Instructions (Signed)
 No evidence of heart attack or blood clot in the lung.  Your CT scan shows that you have a calcification in your neck that may be causing a pinched nerve.  You should follow-up with the spine doctor to have an MRI for further evaluation of this.  Take the steroids and muscle relaxers as prescribed.  Return to the ED with worsening pain, weakness, numbness, tingling, chest pain, shortness of breath, other concerns.

## 2023-10-17 ENCOUNTER — Encounter (HOSPITAL_BASED_OUTPATIENT_CLINIC_OR_DEPARTMENT_OTHER): Payer: Self-pay | Admitting: Emergency Medicine

## 2023-10-17 ENCOUNTER — Emergency Department (HOSPITAL_BASED_OUTPATIENT_CLINIC_OR_DEPARTMENT_OTHER)
Admission: EM | Admit: 2023-10-17 | Discharge: 2023-10-17 | Disposition: A | Discharge status: Home / Self Care | Payer: Medicaid Other | Attending: Emergency Medicine | Admitting: Emergency Medicine

## 2023-10-17 ENCOUNTER — Other Ambulatory Visit: Payer: Self-pay

## 2023-10-17 DIAGNOSIS — M542 Cervicalgia: Secondary | ICD-10-CM | POA: Diagnosis present

## 2023-10-17 DIAGNOSIS — M25512 Pain in left shoulder: Secondary | ICD-10-CM | POA: Diagnosis not present

## 2023-10-17 MED ORDER — OXYCODONE HCL 5 MG PO TABS
5.0000 mg | ORAL_TABLET | Freq: Once | ORAL | Status: AC
  Administered 2023-10-17: 5 mg via ORAL
  Filled 2023-10-17: qty 1

## 2023-10-17 MED ORDER — METHYLPREDNISOLONE 4 MG PO TBPK: ORAL_TABLET | ORAL | 0 refills | Status: DC

## 2023-10-17 MED ORDER — OXYCODONE HCL 5 MG PO TABS: 5.0000 mg | ORAL_TABLET | Freq: Four times a day (QID) | ORAL | 0 refills | Status: DC | PRN

## 2023-10-17 MED ORDER — METHOCARBAMOL 500 MG PO TABS: 500.0000 mg | ORAL_TABLET | Freq: Three times a day (TID) | ORAL | 0 refills | Status: DC | PRN

## 2023-10-17 NOTE — ED Provider Notes (Signed)
 [facility] Provider Note   CSN: [id number] Arrival date & time: 10/17/23  1914     History  Chief Complaint  Patient presents with   Shoulder Pain    William Howe is a 33 y.o. male.  Patient here with ongoing neck/shoulder pain.  Having some radicular symptoms at times but no weakness.  No numbness currently.  Worse with movement.  He has some imaging done recently and is supposed to see spine team next week.  He finished a course of steroids couple days ago felt better but got worse now.  Using lidocaine patches muscle relaxants.  Denies any new trauma or falls.  The history is provided by the patient.       Home Medications Prior to Admission medications   Medication Sig Start Date End Date Taking? Authorizing Provider  methylPREDNISolone (MEDROL DOSEPAK) 4 MG TBPK tablet Follow package insert 10/17/23  Yes Lowana Hable, DO  oxyCODONE (ROXICODONE) 5 MG immediate release tablet Take 1 tablet (5 mg total) by mouth every 6 (six) hours as needed for up to 10 doses. 10/17/23  Yes Skylynn Burkley, DO  amoxicillin (AMOXIL) 500 MG capsule Take 1 capsule (500 mg total) by mouth 3 (three) times daily. 09/10/17   Ward, Layla Maw, DO  ibuprofen (ADVIL,MOTRIN) 200 MG tablet Take 600 mg by mouth every 6 (six) hours as needed. For pain    [provider]  lidocaine (LIDODERM) 5 % Place 1 patch onto the skin daily. Remove & Discard patch within 12 hours or as directed by MD 10/08/23   Rancour, Jeannett Senior, MD  methocarbamol (ROBAXIN) 500 MG tablet Take 1 tablet (500 mg total) by mouth every 8 (eight) hours as needed for muscle spasms. 10/17/23   Azalea Cedar, DO  naproxen (NAPROSYN) 500 MG tablet Take 1 tablet (500 mg total) by mouth 2 (two) times daily. 06/12/20   Venter, Margaux, PA-C  predniSONE (STERAPRED UNI-PAK 21 TAB) 10 MG (21) TBPK tablet Take by mouth daily. Take 6 tabs by mouth daily  for 2 days, then 5 tabs for 2 days, then 4 tabs  for 2 days, then 3 tabs for 2 days, 2 tabs for 2 days, then 1 tab by mouth daily for 2 days 06/12/20   Tanda Rockers, PA-C      Allergies    Patient has no known allergies.    Review of Systems   Review of Systems  Physical Exam Updated Vital Signs BP (!) 130/112 (BP Location: Right Arm)   Pulse 99   Temp 98 F (36.7 C)   Resp 18   Ht 6\' 2"  (1.88 m)   Wt 117.9 kg   SpO2 99%   BMI 33.38 kg/m  Physical Exam Vitals and nursing note reviewed.  Constitutional:      General: He is not in acute distress.    Appearance: He is well-developed. He is not ill-appearing.  HENT:     Head: Normocephalic and atraumatic.     Nose: Nose normal.     Mouth/Throat:     Mouth: Mucous membranes are moist.  Eyes:     Extraocular Movements: Extraocular movements intact.     Conjunctiva/sclera: Conjunctivae normal.     Pupils: Pupils are equal, round, and reactive to light.  Cardiovascular:     Rate and Rhythm: Normal rate and regular rhythm.     Heart sounds: No murmur heard. Pulmonary:     Effort: Pulmonary effort is normal. No respiratory distress.  Breath sounds: Normal breath sounds.  Abdominal:     Palpations: Abdomen is soft.     Tenderness: There is no abdominal tenderness.  Musculoskeletal:        General: Tenderness present. No swelling.     Cervical back: Normal range of motion and neck supple. Tenderness present.     Comments: Tenderness to the left shoulder area, left paraspinal cervical muscles  Skin:    General: Skin is warm and dry.     Capillary Refill: Capillary refill takes less than 2 seconds.  Neurological:     General: No focal deficit present.     Mental Status: He is alert and oriented to person, place, and time.     Cranial Nerves: No cranial nerve deficit.     Sensory: No sensory deficit.     Motor: No weakness.     Coordination: Coordination normal.     Comments: 5+ out of 5 strength throughout, normal sensation  Psychiatric:        Mood and Affect:  Mood normal.     ED Results / Procedures / Treatments   Labs (all labs ordered are listed, but only abnormal results are displayed) Labs Reviewed - No data to display  EKG None  Radiology No results found.  Procedures Procedures    Medications Ordered in ED Medications  oxyCODONE (Oxy IR/ROXICODONE) immediate release tablet 5 mg (has no administration in time range)    ED Course/ Medical Decision Making/ A&P                                 Medical Decision Making Risk Prescription drug management.   William Howe is here with ongoing neck shoulder pain.  Had imaging a few weeks ago that showed some may be cervical radiculopathy process.  He is got good strength and sensation on exam.  He supposed follow-up with spine team next week.  He is neurovascular neuromuscular intact otherwise.  No new trauma.  I suspect ongoing musculoskeletal process.  He did have improvement with steroids we will put him back on a Medrol Dosepak.  Refill his muscle relaxants.  Write him for oxycodone for breakthrough pain.  I have no concern for other acute process.  He is discharged in good condition.  Understands return precautions.  This chart was dictated using voice recognition software.  Despite best efforts to proofread,  errors can occur which can change the documentation meaning.         Final Clinical Impression(s) / ED Diagnoses Final diagnoses:  Neck pain  Acute pain of left shoulder    Rx / DC Orders ED Discharge Orders          Ordered    methylPREDNISolone (MEDROL DOSEPAK) 4 MG TBPK tablet        10/17/23 2233    methocarbamol (ROBAXIN) 500 MG tablet  Every 8 hours PRN        10/17/23 2233    oxyCODONE (ROXICODONE) 5 MG immediate release tablet  Every 6 hours PRN        10/17/23 2233              Virgina Norfolk, DO 10/17/23 2236

## 2023-10-17 NOTE — Discharge Instructions (Signed)
 Restart Medrol Dosepak, I have refilled your muscle relaxant.  I have written you for oxycodone for breakthrough pain.  This medication is sedating so do not mix with alcohol drugs or dangerous activities including driving.  Follow-up with neurosurgery as you have planned.

## 2023-10-17 NOTE — ED Triage Notes (Signed)
 Pt c/o continued left shoulder pain x 3 weeks, recently seen for same. Pt has followed DC recommendations including RICE routine, etc without improvement and today had to leave work early due to pain. Currently rates pain 7/10 constant.

## 2023-11-21 ENCOUNTER — Encounter: Payer: Self-pay | Admitting: Physical Therapy

## 2023-11-21 ENCOUNTER — Ambulatory Visit: Attending: Neurological Surgery | Admitting: Physical Therapy

## 2023-11-21 ENCOUNTER — Other Ambulatory Visit (HOSPITAL_BASED_OUTPATIENT_CLINIC_OR_DEPARTMENT_OTHER): Payer: Self-pay | Admitting: Neurological Surgery

## 2023-11-21 ENCOUNTER — Other Ambulatory Visit: Payer: Self-pay

## 2023-11-21 DIAGNOSIS — R2689 Other abnormalities of gait and mobility: Secondary | ICD-10-CM | POA: Diagnosis present

## 2023-11-21 DIAGNOSIS — M62838 Other muscle spasm: Secondary | ICD-10-CM | POA: Diagnosis present

## 2023-11-21 DIAGNOSIS — M25512 Pain in left shoulder: Secondary | ICD-10-CM | POA: Diagnosis present

## 2023-11-21 DIAGNOSIS — M6281 Muscle weakness (generalized): Secondary | ICD-10-CM | POA: Diagnosis present

## 2023-11-21 DIAGNOSIS — M5412 Radiculopathy, cervical region: Secondary | ICD-10-CM

## 2023-11-21 DIAGNOSIS — R293 Abnormal posture: Secondary | ICD-10-CM | POA: Diagnosis present

## 2023-11-21 NOTE — Therapy (Signed)
 OUTPATIENT PHYSICAL THERAPY CERVICAL EVALUATION   Patient Name: William Howe MRN: [id number] DOB:09/05/1990, 33 y.o., male Today's Date: 11/21/2023  END OF SESSION:  PT End of Session - 11/21/23 1406     Visit Number 1    Number of Visits 12    Date for PT Re-Evaluation 01/02/24    Authorization Type [employer] Medicaid    PT Start Time 1406   late sign in   PT Stop Time 1440    PT Time Calculation (min) 34 min    Activity Tolerance Patient tolerated treatment well    Behavior During Therapy [facility] for tasks assessed/performed             History reviewed. No pertinent past medical history. Past Surgical History:  Procedure Laterality Date   HAND SURGERY     TOE SURGERY     There are no active problems to display for this patient.   PCP: Patient, No Pcp Per  REFERRING PROVIDER: Barnett Abu, MD  REFERRING DIAG: [phone] (ICD-10-CM) - Radiculopathy, cervical region  THERAPY DIAG:  Abnormal posture  Muscle weakness (generalized)  Acute pain of left shoulder  Other muscle spasm  Other abnormalities of gait and mobility  Rationale for Evaluation and Treatment: Rehabilitation  ONSET DATE: ~1-2 months ago  SUBJECTIVE:                                                                                                                                                                                                         SUBJECTIVE STATEMENT: Pt states he was told he has a pinched nerve in the side of his neck causing a lot of pain and tightness in his shoulder. Reports difficulty lifting things and moving around at work. Pt reports that he felt he had a good week last week but this week it has come back. States it's been rough the past few days. Has been using pain medicine for work only. Pt states he is waiting for an MRI. Pt reports he thought he had just slept wrong initially. Would get better after resting from work.  Hand dominance: Right  PERTINENT HISTORY:  Shoulder  pain years ago (had a shot and exercises)  PAIN:  Are you having pain? Yes: NPRS scale: 3 or 4 currently, at worst 8/10 Pain location: L neck to top of the shoulder Pain description: Extreme tightness Aggravating factors: Work activities Relieving factors: Resting at home  PRECAUTIONS: None  RED FLAGS: None     WEIGHT BEARING RESTRICTIONS: No  FALLS:  Has patient fallen in last 6 months?  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  DIAGNOSTIC FINDINGS:  MRI not yet scheduled  PATIENT SURVEYS:  Neck Disability Index score: 14 / 50 = 28.0 %  COGNITION: Overall cognitive status: Within functional limits for tasks assessed  SENSATION: None  POSTURE: rounded shoulders, decreased thoracic kyphosis, and GH head anteriorly and superiorly oriented  PALPATION: TTP & trigger points noted in L UT, pec minor/major, suboccipitals   CERVICAL ROM:   Active ROM A/PROM (deg) eval  Flexion 31  Extension 35  Right lateral flexion 45  Left lateral flexion 35  Right rotation 80  Left rotation 70 "a little discomfort"   (Blank rows = not tested)  UPPER EXTREMITY ROM:  Active ROM Right eval Left eval  Shoulder flexion 160 150 *  Shoulder extension    Shoulder abduction 150 135 *  Shoulder adduction    Shoulder extension 80 70*  Shoulder internal rotation T4 T5*  Shoulder external rotation C7 C7  Elbow flexion    Elbow extension    Wrist flexion    Wrist extension    Wrist ulnar deviation    Wrist radial deviation    Wrist pronation    Wrist supination     (Blank rows = not tested, * = pain)  UPPER EXTREMITY  MMT:  MMT Right eval Left eval  Shoulder flexion 5 4-  Shoulder extension 5 5  Shoulder abduction 5 4+  Shoulder adduction    Shoulder extension    Shoulder internal rotation 5 5  Shoulder external rotation 5 3+ *  Middle trapezius 4 3+  Lower trapezius 3 Unable to test due to pain  Elbow flexion    Elbow extension    Wrist flexion    Wrist extension    Wrist ulnar deviation    Wrist radial deviation    Wrist pronation    Wrist supination    Grip strength     (Blank rows = not tested, * = pain)  CERVICAL SPECIAL TESTS:  Spurling's test: Negative  FUNCTIONAL TESTS:  Did not assess  TREATMENT DATE: 11/21/23 See HEP below Manual therapy STM & TPR L UT Skilled assessment and palpation for TPDN Trigger Point Dry Needling  Initial Treatment: Pt instructed on Dry Needling rational, procedures, and possible side effects. Pt instructed to expect mild to moderate muscle soreness later in the day and/or into the next day.  Pt instructed in methods to reduce muscle soreness. Pt instructed to continue prescribed HEP. Patient was educated on signs and symptoms of infection and other risk factors and advised to seek medical attention should they occur.  Patient verbalized understanding of these instructions and education.   Patient Verbal Consent Given: Yes Education Handout Provided: Yes Muscles Treated: L UT Electrical Stimulation Performed: No Treatment Response/Outcome: Twitch response, improved muscle tightness  PATIENT EDUCATION:  Education details: Exam findings, POC, initial HEP, dry needling Person educated: Patient Education method: Explanation, Demonstration, and Handouts Education comprehension: verbalized understanding, returned demonstration, and needs further education  HOME EXERCISE PROGRAM: Access Code: [email or web address] URL:  [email or web address] Date: 11/21/2023 Prepared by: Vernon Prey April Kirstie Peri  Exercises - Seated Scapular Retraction  - 1 x daily - 7 x weekly - 2 sets - 10 reps - Seated Upper Trapezius Stretch  - 1 x daily - 7 x weekly - 2 sets - 30 sec hold - Gentle Levator Scapulae Stretch  - 1 x daily - 7 x weekly - 2 sets - 30 sec hold - Corner Pec Minor Stretch  - 1 x daily - 7 x weekly - 2 sets - 30 sec hold - Doorway Pec Stretch at 60 Elevation  - 1 x daily - 7 x weekly - 2 sets - 30 sec hold  ASSESSMENT:  CLINICAL IMPRESSION: Patient is a 33 y.o. M who was seen today for physical therapy evaluation and treatment for neck/shoulder pain. Assessment is significant for increased L>R UT and pec shortening resulting in abnormal posture, posterior shoulder/midback weakness leading to UT over compensation and trigger points limiting work and lifting tasks. Pt with highly elevated and rounded bilat shoulders. Performed trial of TPDN to address his L UT tightness. Pt is relatively sedentary at baseline. Pt will greatly benefit from PT to improve his pain for work and home tasks.   OBJECTIVE IMPAIRMENTS: decreased activity tolerance, decreased coordination, decreased endurance, decreased mobility, decreased ROM, decreased strength, increased fascial restrictions, increased muscle spasms, impaired flexibility, impaired UE functional use, improper body mechanics, postural dysfunction, and pain.   ACTIVITY LIMITATIONS: carrying, lifting, sleeping, bed mobility, bathing, toileting, dressing, reach over head, and hygiene/grooming  PARTICIPATION LIMITATIONS: meal prep, cleaning, laundry, community activity, occupation, and yard work  PERSONAL FACTORS: Age, Fitness, Past/current experiences, and Time since onset of injury/illness/exacerbation are also affecting patient's functional outcome.   REHAB POTENTIAL: Good  CLINICAL DECISION MAKING: Evolving/moderate complexity  EVALUATION COMPLEXITY:  Moderate   GOALS: Goals reviewed with patient? Yes  SHORT TERM GOALS: Target date: 12/12/2023   Pt will be ind with initial HEP Baseline:  Goal status: INITIAL  2.  Pt will demo full shoulder ROM without pain Baseline:  Goal status: INITIAL  3.  Pt will be independent with maintaining shoulders down and back Baseline:  Goal status: INITIAL   LONG TERM GOALS: Target date: 01/02/2024   Pt will be ind with management and progression of HEP Baseline:  Goal status: INITIAL  2.  Pt will be able to lift and carry at least 20# without pain for work tasks Baseline:  Goal status: INITIAL  3.  Pt will have improved L shoulder MMT to >/=4/5 to demo increasing postural stability Baseline:  Goal status: INITIAL  4.  Pt will have improved NDI to </=18% to demo MCID Baseline:  Goal status: INITIAL    PLAN:  PT FREQUENCY: 2x/week  PT DURATION: 6 weeks  PLANNED INTERVENTIONS: 97164- PT Re-evaluation, 97110-Therapeutic exercises, 97530- Therapeutic activity, [id number]- Neuromuscular re-education, 97535- Self Care, 16109- Manual therapy, G0283- Electrical stimulation (unattended), 60454- Traction (mechanical), [id number]- Ionotophoresis 4mg /ml Dexamethasone, Patient/Family education, Taping, Dry Needling, Joint mobilization, Spinal mobilization, Cryotherapy, and Moist heat  PLAN FOR NEXT SESSION: Assess response to HEP. How was dry needling? Manual/dry needling as indicated for L UT. Strengthen posterior shoulder girdle, stretch pecs/UTs, decrease UT compensation, work on shoulder ROM   Smt Lokey April Dell Ponto, PT  11/21/2023, 4:02 PM

## 2023-11-23 ENCOUNTER — Ambulatory Visit (HOSPITAL_BASED_OUTPATIENT_CLINIC_OR_DEPARTMENT_OTHER)
Admission: RE | Admit: 2023-11-23 | Discharge: 2023-11-23 | Disposition: A | Discharge status: Home / Self Care | Source: Ambulatory Visit | Attending: Neurological Surgery | Admitting: Neurological Surgery

## 2023-11-23 DIAGNOSIS — M5412 Radiculopathy, cervical region: Secondary | ICD-10-CM | POA: Insufficient documentation

## 2023-12-05 ENCOUNTER — Ambulatory Visit: Attending: Neurological Surgery

## 2023-12-05 DIAGNOSIS — R2689 Other abnormalities of gait and mobility: Secondary | ICD-10-CM | POA: Diagnosis present

## 2023-12-05 DIAGNOSIS — M6281 Muscle weakness (generalized): Secondary | ICD-10-CM | POA: Insufficient documentation

## 2023-12-05 DIAGNOSIS — M25512 Pain in left shoulder: Secondary | ICD-10-CM | POA: Diagnosis present

## 2023-12-05 DIAGNOSIS — R293 Abnormal posture: Secondary | ICD-10-CM | POA: Diagnosis present

## 2023-12-05 DIAGNOSIS — M62838 Other muscle spasm: Secondary | ICD-10-CM | POA: Diagnosis present

## 2023-12-05 NOTE — Therapy (Signed)
 OUTPATIENT PHYSICAL THERAPY CERVICAL EVALUATION   Patient Name: Mainor Hellmann MRN: [id number] DOB:09-Sep-1990, 33 y.o., male Today's Date: 12/05/2023  END OF SESSION:  PT End of Session - 12/05/23 1716     Visit Number 2    Number of Visits 12    Date for PT Re-Evaluation 01/02/24    Authorization Type [employer] Medicaid    PT Start Time 1618    PT Stop Time 1700    PT Time Calculation (min) 42 min    Activity Tolerance Patient tolerated treatment well    Behavior During Therapy [facility] for tasks assessed/performed              History reviewed. No pertinent past medical history. Past Surgical History:  Procedure Laterality Date   HAND SURGERY     TOE SURGERY     There are no active problems to display for this patient.   PCP: Patient, No Pcp Per  REFERRING PROVIDER: Barnett Abu, MD  REFERRING DIAG: [phone] (ICD-10-CM) - Radiculopathy, cervical region  THERAPY DIAG:  Abnormal posture  Muscle weakness (generalized)  Acute pain of left shoulder  Other muscle spasm  Other abnormalities of gait and mobility  Rationale for Evaluation and Treatment: Rehabilitation  ONSET DATE: ~1-2 months ago  SUBJECTIVE:                                                                                                                                                                                                         SUBJECTIVE STATEMENT: Pt reports the exercises really help manage pain but overall the same Hand dominance: Right  PERTINENT HISTORY:  Shoulder pain years ago (had a shot and exercises)  PAIN:  Are you having pain? Yes: NPRS scale: 3 or 4 currently, at worst 8/10 Pain location: L neck to top of the shoulder Pain description: Extreme tightness Aggravating factors: Work activities Relieving factors: Resting at home  PRECAUTIONS: None  RED FLAGS: None     WEIGHT BEARING RESTRICTIONS: No  FALLS:  Has patient fallen in last 6 months? 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  DIAGNOSTIC FINDINGS:  MRI not yet scheduled  PATIENT SURVEYS:  Neck Disability Index score: 14 / 50 =  28.0 %  COGNITION: Overall cognitive status: Within functional limits for tasks assessed  SENSATION: None  POSTURE: rounded shoulders, decreased thoracic kyphosis, and GH head anteriorly and superiorly oriented  PALPATION: TTP & trigger points noted in L UT, pec minor/major, suboccipitals   CERVICAL ROM:   Active ROM A/PROM (deg) eval  Flexion 31  Extension 35  Right lateral flexion 45  Left lateral flexion 35  Right rotation 80  Left rotation 70 "a little discomfort"   (Blank rows = not tested)  UPPER EXTREMITY ROM:  Active ROM Right eval Left eval  Shoulder flexion 160 150 *  Shoulder extension    Shoulder abduction 150 135 *  Shoulder adduction    Shoulder extension 80 70*  Shoulder internal rotation T4 T5*  Shoulder external rotation C7 C7  Elbow flexion    Elbow extension    Wrist flexion    Wrist extension    Wrist ulnar deviation    Wrist radial deviation    Wrist pronation    Wrist supination     (Blank rows = not tested, * = pain)  UPPER EXTREMITY MMT:  MMT Right eval Left eval  Shoulder flexion 5 4-  Shoulder extension 5 5  Shoulder abduction 5 4+  Shoulder adduction    Shoulder extension    Shoulder internal rotation 5 5  Shoulder external rotation 5 3+ *  Middle trapezius 4 3+  Lower trapezius 3 Unable to test due to pain  Elbow flexion    Elbow extension    Wrist flexion    Wrist extension    Wrist ulnar deviation    Wrist radial deviation     Wrist pronation    Wrist supination    Grip strength     (Blank rows = not tested, * = pain)  CERVICAL SPECIAL TESTS:  Spurling's test: Negative  FUNCTIONAL TESTS:  Did not assess  TREATMENT DATE:  12/05/23 UBE L1.0 3 min each way STM to L UT ,LS ,rhomboids UT stretch 2x30" bil Levator stretch 2x30" bil Doorway pec stretch 2x30" Low pec stretch in doorway 2x30" Standing rows GTB x 10  11/21/23 See HEP below Manual therapy STM & TPR L UT Skilled assessment and palpation for TPDN Trigger Point Dry Needling  Initial Treatment: Pt instructed on Dry Needling rational, procedures, and possible side effects. Pt instructed to expect mild to moderate muscle soreness later in the day and/or into the next day.  Pt instructed in methods to reduce muscle soreness. Pt instructed to continue prescribed HEP. Patient was educated on signs and symptoms of infection and other risk factors and advised to seek medical attention should they occur.  Patient verbalized understanding of these instructions and education.   Patient Verbal Consent Given: Yes Education Handout Provided: Yes Muscles Treated: L UT Electrical Stimulation Performed: No Treatment Response/Outcome: Twitch response, improved muscle tightness  PATIENT EDUCATION:  Education details: HEP Person educated: Patient Education method: Programmer, multimedia, Facilities manager, and Handouts Education comprehension: verbalized understanding, returned demonstration, and needs further education  HOME EXERCISE PROGRAM: Access Code: [email or web address] URL: [email or web address] Date: 12/05/2023 Prepared by: Dovie Gell  Exercises - Standing Bilateral Low Shoulder Row with Anchored Resistance  - 1 x daily - 7 x weekly - 2-3 sets - 10 reps - 3-5 sec hold - Seated Upper Trapezius Stretch  - 1 x daily - 7 x  weekly - 2 sets - 30 sec hold - Gentle Levator Scapulae Stretch  - 1 x daily - 7 x weekly - 2 sets - 30 sec hold - Corner Pec Minor Stretch  - 1 x daily - 7 x weekly - 2 sets - 30 sec hold - Doorway Pec Stretch at 60 Elevation  - 1 x daily - 7 x weekly - 2 sets - 30 sec hold  ASSESSMENT:  CLINICAL IMPRESSION: Pt continues to have soft tissue restrictions in his L UT, LS , and rhomboids. Palpated less muscle tension after MT. We reviewed his HEP to ensure understanding. Progressed with postural strengthening with cues provided for scapular retraction.   OBJECTIVE IMPAIRMENTS: decreased activity tolerance, decreased coordination, decreased endurance, decreased mobility, decreased ROM, decreased strength, increased fascial restrictions, increased muscle spasms, impaired flexibility, impaired UE functional use, improper body mechanics, postural dysfunction, and pain.   ACTIVITY LIMITATIONS: carrying, lifting, sleeping, bed mobility, bathing, toileting, dressing, reach over head, and hygiene/grooming  PARTICIPATION LIMITATIONS: meal prep, cleaning, laundry, community activity, occupation, and yard work  PERSONAL FACTORS: Age, Fitness, Past/current experiences, and Time since onset of injury/illness/exacerbation are also affecting patient's functional outcome.   REHAB POTENTIAL: Good  CLINICAL DECISION MAKING: Evolving/moderate complexity  EVALUATION COMPLEXITY: Moderate   GOALS: Goals reviewed with patient? Yes  SHORT TERM GOALS: Target date: 12/12/2023   Pt will be ind with initial HEP Baseline:  Goal status: INITIAL  2.  Pt will demo full shoulder ROM without pain Baseline:  Goal status: INITIAL  3.  Pt will be independent with maintaining shoulders down and back Baseline:  Goal status: INITIAL   LONG TERM GOALS: Target date: 01/02/2024   Pt will be ind with management and progression of HEP Baseline:  Goal status: INITIAL  2.  Pt will be able to lift and carry at least  20# without pain for work tasks Baseline:  Goal status: INITIAL  3.  Pt will have improved L shoulder MMT to >/=4/5 to demo increasing postural stability Baseline:  Goal status: INITIAL  4.  Pt will have improved NDI to </=18% to demo MCID Baseline:  Goal status: INITIAL    PLAN:  PT FREQUENCY: 2x/week  PT DURATION: 6 weeks  PLANNED INTERVENTIONS: 97164- PT Re-evaluation, 97110-Therapeutic exercises, 97530- Therapeutic activity, [id number]- Neuromuscular re-education, 97535- Self Care, 41324- Manual therapy, G0283- Electrical stimulation (unattended), 40102- Traction (mechanical), [id number]- Ionotophoresis 4mg /ml Dexamethasone, Patient/Family education, Taping, Dry Needling, Joint mobilization, Spinal mobilization, Cryotherapy, and Moist heat  PLAN FOR NEXT SESSION: How was dry needling? Manual/dry needling as indicated for L UT. Strengthen posterior shoulder girdle, stretch pecs/UTs, decrease UT compensation, work on shoulder ROM   Samuella Crocker, PTA 12/05/2023, 5:16 PM

## 2023-12-07 ENCOUNTER — Ambulatory Visit

## 2023-12-07 DIAGNOSIS — M6281 Muscle weakness (generalized): Secondary | ICD-10-CM

## 2023-12-07 DIAGNOSIS — M25512 Pain in left shoulder: Secondary | ICD-10-CM

## 2023-12-07 DIAGNOSIS — M62838 Other muscle spasm: Secondary | ICD-10-CM

## 2023-12-07 DIAGNOSIS — R2689 Other abnormalities of gait and mobility: Secondary | ICD-10-CM

## 2023-12-07 DIAGNOSIS — R293 Abnormal posture: Secondary | ICD-10-CM

## 2023-12-07 NOTE — Therapy (Signed)
 OUTPATIENT PHYSICAL THERAPY CERVICAL TREATMENT   Patient Name: Wilder Kurowski MRN: [id number] DOB:[birth year], 33 y.o., male Today's Date: 12/07/2023  END OF SESSION:  PT End of Session - 12/07/23 1615     Visit Number 3    Number of Visits 12    Date for PT Re-Evaluation 01/02/24    Authorization Type [employer]    PT Start Time 1615    PT Stop Time 1700    PT Time Calculation (min) 45 min    Activity Tolerance Patient tolerated treatment well    Behavior During Therapy [facility] for tasks assessed/performed               History reviewed. No pertinent past medical history. Past Surgical History:  Procedure Laterality Date   HAND SURGERY     TOE SURGERY     There are no active problems to display for this patient.   PCP: Patient, No Pcp Per  REFERRING PROVIDER: Barnett Abu, MD  REFERRING DIAG: [phone] (ICD-10-CM) - Radiculopathy, cervical region  THERAPY DIAG:  Abnormal posture  Muscle weakness (generalized)  Acute pain of left shoulder  Other muscle spasm  Other abnormalities of gait and mobility  Rationale for Evaluation and Treatment: Rehabilitation  ONSET DATE: ~1-2 months ago  SUBJECTIVE:                                                                                                                                                                                                         SUBJECTIVE STATEMENT: Pt reports he is doing well with no pain right now, however this is one of his off days. Hand dominance: Right  PERTINENT HISTORY:  Shoulder pain years ago (had a shot and exercises)  PAIN:  Are you having pain? Yes: NPRS scale: 3 or 4 currently, at worst 8/10 Pain location: L neck to top of the shoulder Pain description: Extreme tightness Aggravating factors: Work activities Relieving factors: Resting at home  PRECAUTIONS: None  RED FLAGS: None     WEIGHT BEARING RESTRICTIONS: No  FALLS:  Has patient fallen in last 6 months?  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  DIAGNOSTIC FINDINGS:  MRI not yet scheduled  PATIENT SURVEYS:  Neck Disability Index score: 14 / 50 = 28.0 %  COGNITION: Overall cognitive status: Within functional limits for tasks assessed  SENSATION: None  POSTURE: rounded shoulders, decreased thoracic kyphosis, and GH head anteriorly and superiorly oriented  PALPATION: TTP & trigger points noted in L UT, pec minor/major, suboccipitals   CERVICAL ROM:   Active ROM A/PROM (deg) eval  Flexion 31  Extension 35  Right lateral flexion 45  Left lateral flexion 35  Right rotation 80  Left rotation 70 "a little discomfort"   (Blank rows = not tested)  UPPER EXTREMITY ROM:  Active ROM Right eval Left eval  Shoulder flexion 160 150 *  Shoulder extension    Shoulder abduction 150 135 *  Shoulder adduction    Shoulder extension 80 70*  Shoulder internal rotation T4 T5*  Shoulder external rotation C7 C7  Elbow flexion    Elbow extension    Wrist flexion    Wrist extension    Wrist ulnar deviation    Wrist radial deviation    Wrist pronation    Wrist supination     (Blank rows = not tested, * = pain)  UPPER EXTREMITY MMT:  MMT Right eval Left eval  Shoulder flexion 5 4-  Shoulder extension 5 5  Shoulder abduction 5 4+  Shoulder adduction    Shoulder extension    Shoulder internal rotation 5 5  Shoulder external rotation 5 3+ *  Middle trapezius 4 3+  Lower trapezius 3 Unable to test due to pain  Elbow flexion    Elbow extension    Wrist flexion    Wrist extension    Wrist ulnar deviation    Wrist  radial deviation    Wrist pronation    Wrist supination    Grip strength     (Blank rows = not tested, * = pain)  CERVICAL SPECIAL TESTS:  Spurling's test: Negative  FUNCTIONAL TESTS:  Did not assess  TREATMENT DATE:  12/07/23 UBE L1.0 3 min each way Assisted cervical extension with pillowcase x 10 Assisted cervical rotation with pillowcase x 10 bil Assisted thoracic ext with pillowcase x 10 Standing L shoulder flexion wall slide x 10 Standing L shoulder abduction wall slide x 10 Standing rows GTB 2x10 Standing shoulder extension GTB 2x10 Standing ER RTB x 10 R/L shoulder Low pec stretch in doorway 2x30" Supine chest stretch on 1/2 foam roll 2x30"   12/05/23 UBE L1.0 3 min each way STM to L UT ,LS ,rhomboids UT stretch 2x30" bil Levator stretch 2x30" bil Doorway pec stretch 2x30" Low pec stretch in doorway 2x30" Standing rows GTB x 10  11/21/23 See HEP below Manual therapy STM & TPR L UT Skilled assessment and palpation for TPDN Trigger Point Dry Needling  Initial Treatment: Pt instructed on Dry Needling rational, procedures, and possible side effects. Pt instructed to expect mild to moderate muscle soreness later in the day and/or into the next day.  Pt instructed in methods to reduce muscle soreness. Pt instructed to continue prescribed HEP. Patient was educated on signs and symptoms of infection and other risk factors and advised to seek medical attention should they occur.  Patient verbalized understanding of these instructions and education.   Patient Verbal Consent Given: Yes Education Handout Provided: Yes Muscles Treated: L UT Electrical Stimulation Performed: No Treatment Response/Outcome: Twitch response, improved muscle tightness  PATIENT EDUCATION:  Education details: HEP Person educated: Patient Education method:  Programmer, multimedia, Facilities manager, and Handouts Education comprehension: verbalized understanding, returned demonstration, and needs further education  HOME EXERCISE PROGRAM: Access Code: [email or web address] URL: [email or web address] Date: 12/05/2023 Prepared by: Calyn Sivils  Exercises - Standing Bilateral Low Shoulder Row with Anchored Resistance  - 1 x daily - 7 x weekly - 2-3 sets - 10 reps - 3-5 sec hold - Seated Upper Trapezius Stretch  - 1 x daily - 7 x weekly - 2 sets - 30 sec hold - Gentle Levator Scapulae Stretch  - 1 x daily - 7 x weekly - 2 sets - 30 sec hold - Corner Pec Minor Stretch  - 1 x daily - 7 x weekly - 2 sets - 30 sec hold - Doorway Pec Stretch at 60 Elevation  - 1 x daily - 7 x weekly - 2 sets - 30 sec hold  ASSESSMENT:  CLINICAL IMPRESSION: Advanced with postural strengthening, stretching, and cervical ROM. Instructed with exercises for proper mechanics while performing them. Pt continues to have fwd shoulders and rounded posture. He recently had MRI and notes that he will not need any surgical intervention.   OBJECTIVE IMPAIRMENTS: decreased activity tolerance, decreased coordination, decreased endurance, decreased mobility, decreased ROM, decreased strength, increased fascial restrictions, increased muscle spasms, impaired flexibility, impaired UE functional use, improper body mechanics, postural dysfunction, and pain.   ACTIVITY LIMITATIONS: carrying, lifting, sleeping, bed mobility, bathing, toileting, dressing, reach over head, and hygiene/grooming  PARTICIPATION LIMITATIONS: meal prep, cleaning, laundry, community activity, occupation, and yard work  PERSONAL FACTORS: Age, Fitness, Past/current experiences, and Time since onset of injury/illness/exacerbation are also affecting patient's functional outcome.   REHAB POTENTIAL: Good  CLINICAL DECISION MAKING: Evolving/moderate complexity  EVALUATION COMPLEXITY: Moderate   GOALS: Goals reviewed with  patient? Yes  SHORT TERM GOALS: Target date: 12/12/2023   Pt will be ind with initial HEP Baseline:  Goal status: MET- 12/07/23  2.  Pt will demo full shoulder ROM without pain Baseline:  Goal status: INITIAL  3.  Pt will be independent with maintaining shoulders down and back Baseline:  Goal status: INITIAL   LONG TERM GOALS: Target date: 01/02/2024   Pt will be ind with management and progression of HEP Baseline:  Goal status: INITIAL  2.  Pt will be able to lift and carry at least 20# without pain for work tasks Baseline:  Goal status: INITIAL  3.  Pt will have improved L shoulder MMT to >/=4/5 to demo increasing postural stability Baseline:  Goal status: INITIAL  4.  Pt will have improved NDI to </=18% to demo MCID Baseline:  Goal status: INITIAL    PLAN:  PT FREQUENCY: 2x/week  PT DURATION: 6 weeks  PLANNED INTERVENTIONS: 97164- PT Re-evaluation, 97110-Therapeutic exercises, 97530- Therapeutic activity, 97112- Neuromuscular re-education, 97535- Self Care, 16109- Manual therapy, G0283- Electrical stimulation (unattended), 60454- Traction (mechanical), [id number]- Ionotophoresis 4mg /ml Dexamethasone, Patient/Family education, Taping, Dry Needling, Joint mobilization, Spinal mobilization, Cryotherapy, and Moist heat  PLAN FOR NEXT SESSION: Manual/dry needling as indicated for L UT. Strengthen posterior shoulder girdle, stretch pecs/UTs, decrease UT compensation, work on shoulder ROM   Samuella Crocker, PTA 12/07/2023, 5:13 PM

## 2023-12-12 ENCOUNTER — Ambulatory Visit

## 2023-12-12 DIAGNOSIS — M6281 Muscle weakness (generalized): Secondary | ICD-10-CM

## 2023-12-12 DIAGNOSIS — R293 Abnormal posture: Secondary | ICD-10-CM

## 2023-12-12 DIAGNOSIS — M25512 Pain in left shoulder: Secondary | ICD-10-CM

## 2023-12-12 DIAGNOSIS — R2689 Other abnormalities of gait and mobility: Secondary | ICD-10-CM

## 2023-12-12 DIAGNOSIS — M62838 Other muscle spasm: Secondary | ICD-10-CM

## 2023-12-12 NOTE — Therapy (Signed)
 OUTPATIENT PHYSICAL THERAPY CERVICAL TREATMENT   Patient Name: Manfred Laspina MRN: [id number] DOB:1990-11-28, 33 y.o., male Today's Date: 12/12/2023  END OF SESSION:  PT End of Session - 12/12/23 1622     Visit Number 4    Number of Visits 12    Date for PT Re-Evaluation 01/02/24    Authorization Type [employer] Medicaid    PT Start Time 1618    PT Stop Time 1700    PT Time Calculation (min) 42 min    Activity Tolerance Patient tolerated treatment well    Behavior During Therapy [facility] for tasks assessed/performed                History reviewed. No pertinent past medical history. Past Surgical History:  Procedure Laterality Date   HAND SURGERY     TOE SURGERY     There are no active problems to display for this patient.   PCP: Patient, No Pcp Per  REFERRING PROVIDER: Elna Haggis, MD  REFERRING DIAG: [phone] (ICD-10-CM) - Radiculopathy, cervical region  THERAPY DIAG:  Abnormal posture  Muscle weakness (generalized)  Acute pain of left shoulder  Other muscle spasm  Other abnormalities of gait and mobility  Rationale for Evaluation and Treatment: Rehabilitation  ONSET DATE: ~1-2 months ago  SUBJECTIVE:                                                                                                                                                                                                         SUBJECTIVE STATEMENT: Pt reports he worked a 14 hour shift Friday night very sore later that night and the next day  Hand dominance: Right  PERTINENT HISTORY:  Shoulder pain years ago (had a shot and exercises)  PAIN:  Are you having pain? Yes: NPRS scale: 0/10 Pain location: L neck to top of the shoulder Pain description: Extreme tightness Aggravating factors: Work activities Relieving factors: Resting at home  PRECAUTIONS: None  RED FLAGS: None     WEIGHT BEARING RESTRICTIONS: No  FALLS:  Has patient fallen in last 6 months? 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  DIAGNOSTIC FINDINGS:  MRI not yet scheduled  PATIENT SURVEYS:  Neck Disability Index score:  14 / 50 = 28.0 %  COGNITION: Overall cognitive status: Within functional limits for tasks assessed  SENSATION: None  POSTURE: rounded shoulders, decreased thoracic kyphosis, and GH head anteriorly and superiorly oriented  PALPATION: TTP & trigger points noted in L UT, pec minor/major, suboccipitals   CERVICAL ROM:   Active ROM A/PROM (deg) eval  Flexion 31  Extension 35  Right lateral flexion 45  Left lateral flexion 35  Right rotation 80  Left rotation 70 "a little discomfort"   (Blank rows = not tested)  UPPER EXTREMITY ROM:  Active ROM Right eval Left eval L 12/12/23  Shoulder flexion 160 150 * 153- discomfort  Shoulder extension     Shoulder abduction 150 135 * 150- discomfort  Shoulder adduction     Shoulder extension 80 70* 73  Shoulder internal rotation T4 T5* T6  Shoulder external rotation C7 C7 T3  Elbow flexion     Elbow extension     Wrist flexion     Wrist extension     Wrist ulnar deviation     Wrist radial deviation     Wrist pronation     Wrist supination      (Blank rows = not tested, * = pain)  UPPER EXTREMITY MMT:  MMT Right eval Left eval  Shoulder flexion 5 4-  Shoulder extension 5 5  Shoulder abduction 5 4+  Shoulder adduction    Shoulder extension    Shoulder internal rotation 5 5  Shoulder external rotation 5 3+ *  Middle trapezius 4 3+  Lower trapezius 3 Unable to test due to pain  Elbow flexion    Elbow extension    Wrist flexion    Wrist  extension    Wrist ulnar deviation    Wrist radial deviation    Wrist pronation    Wrist supination    Grip strength     (Blank rows = not tested, * = pain)  CERVICAL SPECIAL TESTS:  Spurling's test: Negative  FUNCTIONAL TESTS:  Did not assess  TREATMENT DATE:  12/12/23 UBE L2.0 3 min each way UE ROM Box lifting to simulate work related activities 17lb with good body mechanics- cues needed to avoid rounding back x 20 Cabinet reaches 2lb bil shoulders x 10; 5lb x 10 bil Doorway high pec stretch 2x30" STM to L UT, upper rhomboids, LS Seated L UT stretch x 30" 12/07/23 UBE L1.0 3 min each way Assisted cervical extension with pillowcase x 10 Assisted cervical rotation with pillowcase x 10 bil Assisted thoracic ext with pillowcase x 10 Standing L shoulder flexion wall slide x 10 Standing L shoulder abduction wall slide x 10 Standing rows GTB 2x10 Standing shoulder extension GTB 2x10 Standing ER RTB x 10 R/L shoulder Low pec stretch in doorway 2x30" Supine chest stretch on 1/2 foam roll 2x30"   12/05/23 UBE L1.0 3 min each way STM to L UT ,LS ,rhomboids UT stretch 2x30" bil Levator stretch 2x30" bil Doorway pec stretch 2x30" Low pec stretch in doorway 2x30" Standing rows GTB x 10  11/21/23 See HEP below Manual therapy STM & TPR L UT Skilled assessment and palpation for TPDN Trigger Point Dry Needling  Initial Treatment: Pt instructed on Dry Needling rational, procedures, and possible side effects. Pt instructed to expect mild to moderate muscle soreness later in the day and/or into the next day.  Pt instructed in methods to reduce muscle soreness. Pt instructed to continue prescribed HEP. Patient was educated on signs and symptoms  of infection and other risk factors and advised to seek medical attention should they occur.  Patient verbalized understanding of these instructions and education.   Patient Verbal Consent Given: Yes Education Handout Provided: Yes Muscles  Treated: L UT Electrical Stimulation Performed: No Treatment Response/Outcome: Twitch response, improved muscle tightness                                                                                                                                   PATIENT EDUCATION:  Education details: HEP Person educated: Patient Education method: Programmer, multimedia, Facilities manager, and Handouts Education comprehension: verbalized understanding, returned demonstration, and needs further education  HOME EXERCISE PROGRAM: Access Code: [email or web address] URL: [email or web address] Date: 12/05/2023 Prepared by: Dovie Gell  Exercises - Standing Bilateral Low Shoulder Row with Anchored Resistance  - 1 x daily - 7 x weekly - 2-3 sets - 10 reps - 3-5 sec hold - Seated Upper Trapezius Stretch  - 1 x daily - 7 x weekly - 2 sets - 30 sec hold - Gentle Levator Scapulae Stretch  - 1 x daily - 7 x weekly - 2 sets - 30 sec hold - Corner Pec Minor Stretch  - 1 x daily - 7 x weekly - 2 sets - 30 sec hold - Doorway Pec Stretch at 60 Elevation  - 1 x daily - 7 x weekly - 2 sets - 30 sec hold  ASSESSMENT:  CLINICAL IMPRESSION: Instructed patient through lifting mechanics and work related activities to improve his overall functional performance. His L shoulder began to fatigue after these interventions, noting increased tightness in her L UT. He does show slight improvement in L shoulder ROM with only discomfort noted. He needs cues to avoid shrugging his shoulders throughout session. He is very tight in UT and persicap area, would benefit from some TPDN.  OBJECTIVE IMPAIRMENTS: decreased activity tolerance, decreased coordination, decreased endurance, decreased mobility, decreased ROM, decreased strength, increased fascial restrictions, increased muscle spasms, impaired flexibility, impaired UE functional use, improper body mechanics, postural dysfunction, and pain.   ACTIVITY LIMITATIONS: carrying, lifting,  sleeping, bed mobility, bathing, toileting, dressing, reach over head, and hygiene/grooming  PARTICIPATION LIMITATIONS: meal prep, cleaning, laundry, community activity, occupation, and yard work  PERSONAL FACTORS: Age, Fitness, Past/current experiences, and Time since onset of injury/illness/exacerbation are also affecting patient's functional outcome.   REHAB POTENTIAL: Good  CLINICAL DECISION MAKING: Evolving/moderate complexity  EVALUATION COMPLEXITY: Moderate   GOALS: Goals reviewed with patient? Yes  SHORT TERM GOALS: Target date: 12/12/2023   Pt will be ind with initial HEP Baseline:  Goal status: MET- 12/07/23  2.  Pt will demo full shoulder ROM without pain Baseline:  Goal status: PARTIALLY MET only tightness/discomfort  3.  Pt will be independent with maintaining shoulders down and back Baseline:  Goal status: PARTIALLY MET- 12/12/23   LONG TERM GOALS: Target date: 01/02/2024   Pt will be ind with management and progression of HEP Baseline:  Goal status: INITIAL  2.  Pt will be able to lift and carry at least 20# without pain for work tasks Baseline:  Goal status: INITIAL  3.  Pt will have improved L shoulder MMT to >/=4/5 to demo increasing postural stability Baseline:  Goal status: INITIAL  4.  Pt will have improved NDI to </=18% to demo MCID Baseline:  Goal status: INITIAL    PLAN:  PT FREQUENCY: 2x/week  PT DURATION: 6 weeks  PLANNED INTERVENTIONS: 97164- PT Re-evaluation, 97110-Therapeutic exercises, 97530- Therapeutic activity, [id number]- Neuromuscular re-education, 97535- Self Care, 16109- Manual therapy, G0283- Electrical stimulation (unattended), 60454- Traction (mechanical), [id number]- Ionotophoresis 4mg /ml Dexamethasone , Patient/Family education, Taping, Dry Needling, Joint mobilization, Spinal mobilization, Cryotherapy, and Moist heat  PLAN FOR NEXT SESSION: Manual/dry needling as indicated for L UT. Strengthen posterior shoulder girdle, stretch  pecs/UTs, decrease UT compensation, work on shoulder ROM   Samuella Crocker, PTA 12/12/2023, 5:16 PM

## 2023-12-14 ENCOUNTER — Ambulatory Visit: Admitting: Physical Therapy

## 2023-12-14 DIAGNOSIS — M62838 Other muscle spasm: Secondary | ICD-10-CM

## 2023-12-14 DIAGNOSIS — R2689 Other abnormalities of gait and mobility: Secondary | ICD-10-CM

## 2023-12-14 DIAGNOSIS — M25512 Pain in left shoulder: Secondary | ICD-10-CM

## 2023-12-14 DIAGNOSIS — R293 Abnormal posture: Secondary | ICD-10-CM | POA: Diagnosis not present

## 2023-12-14 DIAGNOSIS — M6281 Muscle weakness (generalized): Secondary | ICD-10-CM

## 2023-12-14 NOTE — Therapy (Signed)
 OUTPATIENT PHYSICAL THERAPY CERVICAL TREATMENT   Patient Name: William Howe MRN: [id number] DOB:06/06/1991, 33 y.o., male Today's Date: 12/14/2023  END OF SESSION:  PT End of Session - 12/14/23 1527     Visit Number 5    Number of Visits 12    Date for PT Re-Evaluation 01/02/24    Authorization Type [employer]    PT Start Time 1527    PT Stop Time 1605    PT Time Calculation (min) 38 min    Activity Tolerance Patient tolerated treatment well    Behavior During Therapy [facility] for tasks assessed/performed              No past medical history on file. Past Surgical History:  Procedure Laterality Date   HAND SURGERY     TOE SURGERY     There are no active problems to display for this patient.   PCP: Patient, No Pcp Per  REFERRING PROVIDER: Elna Haggis, MD  REFERRING DIAG: [id number] (ICD-10-CM) - Radiculopathy, cervical region  THERAPY DIAG:  Abnormal posture  Muscle weakness (generalized)  Acute pain of left shoulder  Other muscle spasm  Other abnormalities of gait and mobility  Rationale for Evaluation and Treatment: Rehabilitation  ONSET DATE: ~1-2 months ago  SUBJECTIVE:                                                                                                                                                                                                         SUBJECTIVE STATEMENT: Pt has been more mindful with his posture and his neck. States his legs are still sore from lifting last session.  Hand dominance: Right  PERTINENT HISTORY:  Shoulder pain years ago (had a shot and exercises)  PAIN:  Are you having pain? Yes: NPRS scale: 0/10 Pain location: L neck to top of the shoulder Pain description: Extreme tightness Aggravating factors: Work activities Relieving factors: Resting at home  PRECAUTIONS: None  RED FLAGS: None     WEIGHT BEARING RESTRICTIONS: No  FALLS:  Has patient fallen in last 6 months? 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  DIAGNOSTIC FINDINGS:  MRI not yet scheduled  PATIENT SURVEYS:  Neck Disability Index score: 14 /  50 = 28.0 %  COGNITION: Overall cognitive status: Within functional limits for tasks assessed  SENSATION: None  POSTURE: rounded shoulders, decreased thoracic kyphosis, and GH head anteriorly and superiorly oriented  PALPATION: TTP & trigger points noted in L UT, pec minor/major, suboccipitals   CERVICAL ROM:   Active ROM A/PROM (deg) eval  Flexion 31  Extension 35  Right lateral flexion 45  Left lateral flexion 35  Right rotation 80  Left rotation 70 "a little discomfort"   (Blank rows = not tested)  UPPER EXTREMITY ROM:  Active ROM Right eval Left eval L 12/12/23  Shoulder flexion 160 150 * 153- discomfort  Shoulder extension     Shoulder abduction 150 135 * 150- discomfort  Shoulder adduction     Shoulder extension 80 70* 73  Shoulder internal rotation T4 T5* T6  Shoulder external rotation C7 C7 T3  Elbow flexion     Elbow extension     Wrist flexion     Wrist extension     Wrist ulnar deviation     Wrist radial deviation     Wrist pronation     Wrist supination      (Blank rows = not tested, * = pain)  UPPER EXTREMITY MMT:  MMT Right eval Left eval  Shoulder flexion 5 4-  Shoulder extension 5 5  Shoulder abduction 5 4+  Shoulder adduction    Shoulder extension    Shoulder internal rotation 5 5  Shoulder external rotation 5 3+ *  Middle trapezius 4 3+  Lower trapezius 3 Unable to test due to pain  Elbow flexion    Elbow extension    Wrist flexion    Wrist  extension    Wrist ulnar deviation    Wrist radial deviation    Wrist pronation    Wrist supination    Grip strength     (Blank rows = not tested, * = pain)  CERVICAL SPECIAL TESTS:  Spurling's test: Negative  FUNCTIONAL TESTS:  Did not assess  TREATMENT DATE:  12/14/23 UBE L1; 3 min fwd, 3 min bwd Seated thoracic extension x10 Doorway pec stretch low, mid, high x30" each Trigger Point Dry Needling  Subsequent Treatment: Instructions provided previously at initial dry needling treatment.   Patient Verbal Consent Given: Yes Education Handout Provided: Previously Provided Muscles Treated: Bilat UT Electrical Stimulation Performed: No Treatment Response/Outcome: Decreased muscle tension  UT stretch x 30" R&L Scap depression 20# on lat pull down machine 2x10 Scap depression with lat pull down 20# 2x10 Low Row 15# 2x10 Mid row 15# 2x10 "W" green TB 2x10 Lat pull down green TB demo to perform at home Bottoms up [employer] 5# x2 laps Serratus anterior slide on wall 2x10  12/12/23 UBE L2.0 3 min each way UE ROM Box lifting to simulate work related activities 17lb with good body mechanics- cues needed to avoid rounding back x 20 Cabinet reaches 2lb bil shoulders x 10; 5lb x 10 bil Doorway high pec stretch 2x30" STM to L UT, upper rhomboids, LS Seated L UT stretch x 30"  12/07/23 UBE L1.0 3 min each way Assisted cervical extension with pillowcase x 10 Assisted cervical rotation with pillowcase x 10 bil Assisted thoracic ext with pillowcase x 10 Standing L shoulder flexion wall slide x 10 Standing L shoulder abduction wall slide x 10 Standing rows GTB 2x10 Standing shoulder extension GTB 2x10 Standing ER RTB x 10 R/L shoulder Low pec stretch in doorway 2x30" Supine chest stretch  on 1/2 foam roll 2x30"   12/05/23 UBE L1.0 3 min each way STM to L UT ,LS ,rhomboids UT stretch 2x30" bil Levator stretch 2x30" bil Doorway pec stretch 2x30" Low pec stretch in doorway  2x30" Standing rows GTB x 10  11/21/23 See HEP below Manual therapy STM & TPR L UT Skilled assessment and palpation for TPDN Trigger Point Dry Needling  Initial Treatment: Pt instructed on Dry Needling rational, procedures, and possible side effects. Pt instructed to expect mild to moderate muscle soreness later in the day and/or into the next day.  Pt instructed in methods to reduce muscle soreness. Pt instructed to continue prescribed HEP. Patient was educated on signs and symptoms of infection and other risk factors and advised to seek medical attention should they occur.  Patient verbalized understanding of these instructions and education.   Patient Verbal Consent Given: Yes Education Handout Provided: Yes Muscles Treated: L UT Electrical Stimulation Performed: No Treatment Response/Outcome: Twitch response, improved muscle tightness                                                                                                                                   PATIENT EDUCATION:  Education details: HEP Person educated: Patient Education method: Programmer, multimedia, Facilities manager, and Handouts Education comprehension: verbalized understanding, returned demonstration, and needs further education  HOME EXERCISE PROGRAM: Access Code: [email or web address] URL: [email or web address] Date: 12/14/2023 Prepared by: Royale Lennartz April Erman Hayward  Exercises - Seated Upper Trapezius Stretch  - 1 x daily - 7 x weekly - 2 sets - 30 sec hold - Gentle Levator Scapulae Stretch  - 1 x daily - 7 x weekly - 2 sets - 30 sec hold - Corner Pec Minor Stretch  - 1 x daily - 7 x weekly - 2 sets - 30 sec hold - Doorway Pec Stretch at 60 Elevation  - 1 x daily - 7 x weekly - 2 sets - 30 sec hold - Standing Bilateral Low Shoulder Row with Anchored Resistance  - 1 x daily - 7 x weekly - 2-3 sets - 10 reps - 3-5 sec hold - Shoulder W - External Rotation with Resistance  - 1 x daily - 7 x weekly - 2-3 sets -  10 reps - Seated Lat Pull Down with Resistance - Elbows Bent  - 1 x daily - 7 x weekly - 2-3 sets - 10 reps - Serratus Activation at Wall  - 1 x daily - 7 x weekly - 2-3 sets - 10 reps  ASSESSMENT:  CLINICAL IMPRESSION: Performed dry needling this session to address continued L UT spasm. Working on improving periscapular muscle strength and coordination to pull away from UTs and decrease UT compensation. Challenged with maintaining scapular depression with lat pull down. Remains very shaky on L with overhead movement when cued to keep UT from activating. Added to pt's HEP to include more strengthening.   OBJECTIVE IMPAIRMENTS: decreased activity tolerance, decreased  coordination, decreased endurance, decreased mobility, decreased ROM, decreased strength, increased fascial restrictions, increased muscle spasms, impaired flexibility, impaired UE functional use, improper body mechanics, postural dysfunction, and pain.   ACTIVITY LIMITATIONS: carrying, lifting, sleeping, bed mobility, bathing, toileting, dressing, reach over head, and hygiene/grooming  PARTICIPATION LIMITATIONS: meal prep, cleaning, laundry, community activity, occupation, and yard work  PERSONAL FACTORS: Age, Fitness, Past/current experiences, and Time since onset of injury/illness/exacerbation are also affecting patient's functional outcome.   REHAB POTENTIAL: Good  CLINICAL DECISION MAKING: Evolving/moderate complexity  EVALUATION COMPLEXITY: Moderate   GOALS: Goals reviewed with patient? Yes  SHORT TERM GOALS: Target date: 12/12/2023   Pt will be ind with initial HEP Baseline:  Goal status: MET- 12/07/23  2.  Pt will demo full shoulder ROM without pain Baseline:  Goal status: PARTIALLY MET only tightness/discomfort  3.  Pt will be independent with maintaining shoulders down and back Baseline:  Goal status: PARTIALLY MET- 12/12/23   LONG TERM GOALS: Target date: 01/02/2024   Pt will be ind with management and  progression of HEP Baseline:  Goal status: INITIAL  2.  Pt will be able to lift and carry at least 20# without pain for work tasks Baseline:  Goal status: INITIAL  3.  Pt will have improved L shoulder MMT to >/=4/5 to demo increasing postural stability Baseline:  Goal status: INITIAL  4.  Pt will have improved NDI to </=18% to demo MCID Baseline:  Goal status: INITIAL    PLAN:  PT FREQUENCY: 2x/week  PT DURATION: 6 weeks  PLANNED INTERVENTIONS: 97164- PT Re-evaluation, 97110-Therapeutic exercises, 97530- Therapeutic activity, [id number]- Neuromuscular re-education, 97535- Self Care, 16109- Manual therapy, G0283- Electrical stimulation (unattended), 60454- Traction (mechanical), [id number]- Ionotophoresis 4mg /ml Dexamethasone , Patient/Family education, Taping, Dry Needling, Joint mobilization, Spinal mobilization, Cryotherapy, and Moist heat  PLAN FOR NEXT SESSION: Manual/dry needling as indicated for L UT. Strengthen posterior shoulder girdle, stretch pecs/UTs, decrease UT compensation, work on shoulder ROM   Veniamin Kincaid April Ma L Jeffren Dombek, PT 12/14/2023, 3:27 PM

## 2023-12-19 ENCOUNTER — Ambulatory Visit: Admitting: Physical Therapy

## 2023-12-19 DIAGNOSIS — M25512 Pain in left shoulder: Secondary | ICD-10-CM

## 2023-12-19 DIAGNOSIS — M62838 Other muscle spasm: Secondary | ICD-10-CM

## 2023-12-19 DIAGNOSIS — R293 Abnormal posture: Secondary | ICD-10-CM | POA: Diagnosis not present

## 2023-12-19 DIAGNOSIS — R2689 Other abnormalities of gait and mobility: Secondary | ICD-10-CM

## 2023-12-19 DIAGNOSIS — M6281 Muscle weakness (generalized): Secondary | ICD-10-CM

## 2023-12-19 NOTE — Therapy (Signed)
 OUTPATIENT PHYSICAL THERAPY CERVICAL TREATMENT   Patient Name: William Howe MRN: [id number] DOB:1990/11/28, 33 y.o., male Today's Date: 12/19/2023  END OF SESSION:  PT End of Session - 12/19/23 1533     Visit Number 6    Number of Visits 12    Date for PT Re-Evaluation 01/02/24    Authorization Type [employer] Medicaid    PT Start Time 1533    PT Stop Time 1611    PT Time Calculation (min) 38 min    Activity Tolerance Patient tolerated treatment well    Behavior During Therapy [facility] for tasks assessed/performed            No past medical history on file. Past Surgical History:  Procedure Laterality Date   HAND SURGERY     TOE SURGERY     There are no active problems to display for this patient.   PCP: Patient, No Pcp Per  REFERRING PROVIDER: Elna Haggis, MD  REFERRING DIAG: [phone] (ICD-10-CM) - Radiculopathy, cervical region  THERAPY DIAG:  Abnormal posture  Muscle weakness (generalized)  Acute pain of left shoulder  Other muscle spasm  Other abnormalities of gait and mobility  Rationale for Evaluation and Treatment: Rehabilitation  ONSET DATE: ~1-2 months ago  SUBJECTIVE:                                                                                                                                                                                                         SUBJECTIVE STATEMENT: Pt states he worked the whole weekend. Pt reports it was a rough week and had to stop and stretch a lot. Has just woken up an hour ago so not feeling anything currently.  Hand dominance: Right  PERTINENT HISTORY:  Shoulder pain years ago (had a shot and exercises)  PAIN:  Are you having pain? Yes: NPRS scale: 0/10 Pain location: L neck to top of the shoulder Pain description: Extreme tightness Aggravating factors: Work activities Relieving factors: Resting at home  PRECAUTIONS: None  RED FLAGS: None     WEIGHT BEARING RESTRICTIONS: No  FALLS:  Has patient  fallen in last 6 months? 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  DIAGNOSTIC FINDINGS:  MRI not  yet scheduled  PATIENT SURVEYS:  Neck Disability Index score: 14 / 50 = 28.0 %  COGNITION: Overall cognitive status: Within functional limits for tasks assessed  SENSATION: None  POSTURE: rounded shoulders, decreased thoracic kyphosis, and GH head anteriorly and superiorly oriented  PALPATION: TTP & trigger points noted in L UT, pec minor/major, suboccipitals   CERVICAL ROM:   Active ROM A/PROM (deg) eval  Flexion 31  Extension 35  Right lateral flexion 45  Left lateral flexion 35  Right rotation 80  Left rotation 70 "a little discomfort"   (Blank rows = not tested)  UPPER EXTREMITY ROM:  Active ROM Right eval Left eval L 12/12/23  Shoulder flexion 160 150 * 153- discomfort  Shoulder extension     Shoulder abduction 150 135 * 150- discomfort  Shoulder adduction     Shoulder extension 80 70* 73  Shoulder internal rotation T4 T5* T6  Shoulder external rotation C7 C7 T3  Elbow flexion     Elbow extension     Wrist flexion     Wrist extension     Wrist ulnar deviation     Wrist radial deviation     Wrist pronation     Wrist supination      (Blank rows = not tested, * = pain)  UPPER EXTREMITY MMT:  MMT Right eval Left eval  Shoulder flexion 5 4-  Shoulder extension 5 5  Shoulder abduction 5 4+  Shoulder adduction    Shoulder extension    Shoulder internal rotation 5 5  Shoulder external rotation 5 3+ *  Middle trapezius 4 3+  Lower trapezius 3 Unable to test due to pain  Elbow flexion    Elbow  extension    Wrist flexion    Wrist extension    Wrist ulnar deviation    Wrist radial deviation    Wrist pronation    Wrist supination    Grip strength     (Blank rows = not tested, * = pain)  CERVICAL SPECIAL TESTS:  Spurling's test: Negative  FUNCTIONAL TESTS:  Did not assess  TREATMENT DATE:  12/19/23 UBE L4; 3 min fwd, 3 min bwd Doorway pec stretch low, mid, high x30" each UT stretch x30" R&L Seated scap depression hands on mat table 2x10 Seated shoulder ER green TB 2x10 "W" green TB 2x10 Horizontal shoulder abd green TB 2x10 Serratus activation at wall into low trap setting 2x10 Serratus activation with yellow TB shoulder flexion x10 Standing cervical retraction + shoulder flex 4# x10, 3# x10 Standing cervical retraction + scaption 4# 2x10  12/14/23 UBE L1; 3 min fwd, 3 min bwd Seated thoracic extension x10 Doorway pec stretch low, mid, high x30" each Trigger Point Dry Needling  Subsequent Treatment: Instructions provided previously at initial dry needling treatment.   Patient Verbal Consent Given: Yes Education Handout Provided: Previously Provided Muscles Treated: Bilat UT Electrical Stimulation Performed: No Treatment Response/Outcome: Decreased muscle tension  UT stretch x 30" R&L Scap depression 20# on lat pull down machine 2x10 Scap depression with lat pull down 20# 2x10 Low Row 15# 2x10 Mid row 15# 2x10 "W" green TB 2x10 Lat pull down green TB demo to perform at home Bottoms up [employer] 5# x2 laps Serratus anterior slide on wall 2x10  12/12/23 UBE L2.0 3 min each way UE ROM Box lifting to simulate work related activities 17lb with good body mechanics- cues needed to avoid rounding back x 20 Cabinet reaches 2lb bil shoulders x 10; 5lb  x 10 bil Doorway high pec stretch 2x30" STM to L UT, upper rhomboids, LS Seated L UT stretch x 30"  12/07/23 UBE L1.0 3 min each way Assisted cervical extension with pillowcase x 10 Assisted cervical rotation  with pillowcase x 10 bil Assisted thoracic ext with pillowcase x 10 Standing L shoulder flexion wall slide x 10 Standing L shoulder abduction wall slide x 10 Standing rows GTB 2x10 Standing shoulder extension GTB 2x10 Standing ER RTB x 10 R/L shoulder Low pec stretch in doorway 2x30" Supine chest stretch on 1/2 foam roll 2x30"   12/05/23 UBE L1.0 3 min each way STM to L UT ,LS ,rhomboids UT stretch 2x30" bil Levator stretch 2x30" bil Doorway pec stretch 2x30" Low pec stretch in doorway 2x30" Standing rows GTB x 10  11/21/23 See HEP below Manual therapy STM & TPR L UT Skilled assessment and palpation for TPDN Trigger Point Dry Needling  Initial Treatment: Pt instructed on Dry Needling rational, procedures, and possible side effects. Pt instructed to expect mild to moderate muscle soreness later in the day and/or into the next day.  Pt instructed in methods to reduce muscle soreness. Pt instructed to continue prescribed HEP. Patient was educated on signs and symptoms of infection and other risk factors and advised to seek medical attention should they occur.  Patient verbalized understanding of these instructions and education.   Patient Verbal Consent Given: Yes Education Handout Provided: Yes Muscles Treated: L UT Electrical Stimulation Performed: No Treatment Response/Outcome: Twitch response, improved muscle tightness                                                                                                                                   PATIENT EDUCATION:  Education details: HEP Person educated: Patient Education method: Programmer, multimedia, Facilities manager, and Handouts Education comprehension: verbalized understanding, returned demonstration, and needs further education  HOME EXERCISE PROGRAM: Access Code: [email or web address] URL: [email or web address] Date: 12/14/2023 Prepared by: Aquiles Ruffini April Erman Hayward  Exercises - Seated Upper Trapezius Stretch  - 1 x  daily - 7 x weekly - 2 sets - 30 sec hold - Gentle Levator Scapulae Stretch  - 1 x daily - 7 x weekly - 2 sets - 30 sec hold - Corner Pec Minor Stretch  - 1 x daily - 7 x weekly - 2 sets - 30 sec hold - Doorway Pec Stretch at 60 Elevation  - 1 x daily - 7 x weekly - 2 sets - 30 sec hold - Standing Bilateral Low Shoulder Row with Anchored Resistance  - 1 x daily - 7 x weekly - 2-3 sets - 10 reps - 3-5 sec hold - Shoulder W - External Rotation with Resistance  - 1 x daily - 7 x weekly - 2-3 sets - 10 reps - Seated Lat Pull Down with Resistance - Elbows Bent  - 1 x daily - 7 x weekly - 2-3 sets - 10 reps -  Serratus Activation at Wall  - 1 x daily - 7 x weekly - 2-3 sets - 10 reps  ASSESSMENT:  CLINICAL IMPRESSION: Working on improving strength for overhead lifting. Fatigues with 4# weight during shoulder flexion. Improving scapular and midback strength. Focused on supraspinatus strengthening this session as this remains one of his weakest rotator cuff muscles. Gets pulling into his neck with increased reps at 3#.   OBJECTIVE IMPAIRMENTS: decreased activity tolerance, decreased coordination, decreased endurance, decreased mobility, decreased ROM, decreased strength, increased fascial restrictions, increased muscle spasms, impaired flexibility, impaired UE functional use, improper body mechanics, postural dysfunction, and pain.   ACTIVITY LIMITATIONS: carrying, lifting, sleeping, bed mobility, bathing, toileting, dressing, reach over head, and hygiene/grooming  PARTICIPATION LIMITATIONS: meal prep, cleaning, laundry, community activity, occupation, and yard work  PERSONAL FACTORS: Age, Fitness, Past/current experiences, and Time since onset of injury/illness/exacerbation are also affecting patient's functional outcome.   REHAB POTENTIAL: Good  CLINICAL DECISION MAKING: Evolving/moderate complexity  EVALUATION COMPLEXITY: Moderate   GOALS: Goals reviewed with patient? Yes  SHORT TERM GOALS:  Target date: 12/12/2023   Pt will be ind with initial HEP Baseline:  Goal status: MET- 12/07/23  2.  Pt will demo full shoulder ROM without pain Baseline:  Goal status: PARTIALLY MET only tightness/discomfort  3.  Pt will be independent with maintaining shoulders down and back Baseline:  Goal status: PARTIALLY MET- 12/12/23   LONG TERM GOALS: Target date: 01/02/2024   Pt will be ind with management and progression of HEP Baseline:  Goal status: INITIAL  2.  Pt will be able to lift and carry at least 20# without pain for work tasks Baseline:  Goal status: INITIAL  3.  Pt will have improved L shoulder MMT to >/=4/5 to demo increasing postural stability Baseline:  Goal status: INITIAL  4.  Pt will have improved NDI to </=18% to demo MCID Baseline:  Goal status: INITIAL    PLAN:  PT FREQUENCY: 2x/week  PT DURATION: 6 weeks  PLANNED INTERVENTIONS: 97164- PT Re-evaluation, 97110-Therapeutic exercises, 97530- Therapeutic activity, [id number]- Neuromuscular re-education, 97535- Self Care, [id number]- Manual therapy, G0283- Electrical stimulation (unattended), 25956- Traction (mechanical), [id number]- Ionotophoresis 4mg /ml Dexamethasone , Patient/Family education, Taping, Dry Needling, Joint mobilization, Spinal mobilization, Cryotherapy, and Moist heat  PLAN FOR NEXT SESSION: Manual/dry needling as indicated for L UT. Strengthen posterior shoulder girdle, stretch pecs/UTs, decrease UT compensation, work on shoulder ROM   Gaylia Kassel April Ma L Olando Willems, PT 12/19/2023, 3:33 PM

## 2023-12-21 ENCOUNTER — Ambulatory Visit

## 2023-12-21 DIAGNOSIS — M62838 Other muscle spasm: Secondary | ICD-10-CM

## 2023-12-21 DIAGNOSIS — M6281 Muscle weakness (generalized): Secondary | ICD-10-CM

## 2023-12-21 DIAGNOSIS — R2689 Other abnormalities of gait and mobility: Secondary | ICD-10-CM

## 2023-12-21 DIAGNOSIS — M25512 Pain in left shoulder: Secondary | ICD-10-CM

## 2023-12-21 DIAGNOSIS — R293 Abnormal posture: Secondary | ICD-10-CM

## 2023-12-21 NOTE — Therapy (Signed)
 OUTPATIENT PHYSICAL THERAPY CERVICAL TREATMENT   Patient Name: Roudy Bonnette MRN: [id number] DOB:1990-09-20, 33 y.o., male Today's Date: 12/21/2023  END OF SESSION:  PT End of Session - 12/21/23 1700     Visit Number 7    Number of Visits 12    Date for PT Re-Evaluation 01/02/24    Authorization Type [employer]    PT Start Time 1617    PT Stop Time 1700    PT Time Calculation (min) 43 min    Activity Tolerance Patient tolerated treatment well    Behavior During Therapy [facility] for tasks assessed/performed             History reviewed. No pertinent past medical history. Past Surgical History:  Procedure Laterality Date   HAND SURGERY     TOE SURGERY     There are no active problems to display for this patient.   PCP: Patient, No Pcp Per  REFERRING PROVIDER: Elna Haggis, MD  REFERRING DIAG: [phone] (ICD-10-CM) - Radiculopathy, cervical region  THERAPY DIAG:  Abnormal posture  Muscle weakness (generalized)  Acute pain of left shoulder  Other muscle spasm  Other abnormalities of gait and mobility  Rationale for Evaluation and Treatment: Rehabilitation  ONSET DATE: ~1-2 months ago  SUBJECTIVE:                                                                                                                                                                                                         SUBJECTIVE STATEMENT: It's been good, haven't been working as much. Hand dominance: Right  PERTINENT HISTORY:  Shoulder pain years ago (had a shot and exercises)  PAIN:  Are you having pain? Yes: NPRS scale: 0/10 Pain location: L neck to top of the shoulder Pain description: Extreme tightness Aggravating factors: Work activities Relieving factors: Resting at home  PRECAUTIONS: None  RED FLAGS: None     WEIGHT BEARING RESTRICTIONS: No  FALLS:  Has patient fallen in last 6 months? 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  DIAGNOSTIC FINDINGS:  MRI not yet scheduled  PATIENT SURVEYS:  Neck Disability Index score: 14 / 50 = 28.0 %  COGNITION: Overall cognitive status: Within functional limits for  tasks assessed  SENSATION: None  POSTURE: rounded shoulders, decreased thoracic kyphosis, and GH head anteriorly and superiorly oriented  PALPATION: TTP & trigger points noted in L UT, pec minor/major, suboccipitals   CERVICAL ROM:   Active ROM A/PROM (deg) eval  Flexion 31  Extension 35  Right lateral flexion 45  Left lateral flexion 35  Right rotation 80  Left rotation 70 "a little discomfort"   (Blank rows = not tested)  UPPER EXTREMITY ROM:  Active ROM Right eval Left eval L 12/12/23  Shoulder flexion 160 150 * 153- discomfort  Shoulder extension     Shoulder abduction 150 135 * 150- discomfort  Shoulder adduction     Shoulder extension 80 70* 73  Shoulder internal rotation T4 T5* T6  Shoulder external rotation C7 C7 T3  Elbow flexion     Elbow extension     Wrist flexion     Wrist extension     Wrist ulnar deviation     Wrist radial deviation     Wrist pronation     Wrist supination      (Blank rows = not tested, * = pain)  UPPER EXTREMITY MMT:  MMT Right eval Left eval  Shoulder flexion 5 4-  Shoulder extension 5 5  Shoulder abduction 5 4+  Shoulder adduction    Shoulder extension    Shoulder internal rotation 5 5  Shoulder external rotation 5 3+ *  Middle trapezius 4 3+  Lower trapezius 3 Unable to test due to pain  Elbow flexion    Elbow extension    Wrist flexion    Wrist extension    Wrist ulnar deviation    Wrist radial  deviation    Wrist pronation    Wrist supination    Grip strength     (Blank rows = not tested, * = pain)  CERVICAL SPECIAL TESTS:  Spurling's test: Negative  FUNCTIONAL TESTS:  Did not assess  TREATMENT DATE:  12/21/23 UBE L3; 3 min fwd, 3 min bwd Seated shoulder ER blue TB 2x10 "W" blue TB 2x10 Horizontal shoulder abd blue TB 2x10 Lower trap set on wall blue TB x 10 Standing cervical retraction + shoulder flex 3# x10 Standing cervical retraction + scaption 3# x 10 Cat/cow x 10 Open books 5x B Neck Disability Index score: 4 / 50 = 8.0 %  12/19/23 UBE L4; 3 min fwd, 3 min bwd Doorway pec stretch low, mid, high x30" each UT stretch x30" R&L Seated scap depression hands on mat table 2x10 Seated shoulder ER green TB 2x10 "W" green TB 2x10 Horizontal shoulder abd green TB 2x10 Serratus activation at wall into low trap setting 2x10 Serratus activation with yellow TB shoulder flexion x10 Standing cervical retraction + shoulder flex 4# x10, 3# x10 Standing cervical retraction + scaption 4# 2x10  12/14/23 UBE L1; 3 min fwd, 3 min bwd Seated thoracic extension x10 Doorway pec stretch low, mid, high x30" each Trigger Point Dry Needling  Subsequent Treatment: Instructions provided previously at initial dry needling treatment.   Patient Verbal Consent Given: Yes Education Handout Provided: Previously Provided Muscles Treated: Bilat UT Electrical Stimulation Performed: No Treatment Response/Outcome: Decreased muscle tension  UT stretch x 30" R&L Scap depression 20# on lat pull down machine 2x10 Scap depression with lat pull down 20# 2x10 Low Row 15# 2x10 Mid row 15# 2x10 "W" green TB 2x10 Lat pull down green TB demo to perform at home Bottoms up [employer] 5# x2 laps Serratus  anterior slide on wall 2x10  12/12/23 UBE L2.0 3 min each way UE ROM Box lifting to simulate work related activities 17lb with good body mechanics- cues needed to avoid rounding back x  20 Cabinet reaches 2lb bil shoulders x 10; 5lb x 10 bil Doorway high pec stretch 2x30" STM to L UT, upper rhomboids, LS Seated L UT stretch x 30"  12/07/23 UBE L1.0 3 min each way Assisted cervical extension with pillowcase x 10 Assisted cervical rotation with pillowcase x 10 bil Assisted thoracic ext with pillowcase x 10 Standing L shoulder flexion wall slide x 10 Standing L shoulder abduction wall slide x 10 Standing rows GTB 2x10 Standing shoulder extension GTB 2x10 Standing ER RTB x 10 R/L shoulder Low pec stretch in doorway 2x30" Supine chest stretch on 1/2 foam roll 2x30"   12/05/23 UBE L1.0 3 min each way STM to L UT ,LS ,rhomboids UT stretch 2x30" bil Levator stretch 2x30" bil Doorway pec stretch 2x30" Low pec stretch in doorway 2x30" Standing rows GTB x 10  11/21/23 See HEP below Manual therapy STM & TPR L UT Skilled assessment and palpation for TPDN Trigger Point Dry Needling  Initial Treatment: Pt instructed on Dry Needling rational, procedures, and possible side effects. Pt instructed to expect mild to moderate muscle soreness later in the day and/or into the next day.  Pt instructed in methods to reduce muscle soreness. Pt instructed to continue prescribed HEP. Patient was educated on signs and symptoms of infection and other risk factors and advised to seek medical attention should they occur.  Patient verbalized understanding of these instructions and education.   Patient Verbal Consent Given: Yes Education Handout Provided: Yes Muscles Treated: L UT Electrical Stimulation Performed: No Treatment Response/Outcome: Twitch response, improved muscle tightness                                                                                                                                   PATIENT EDUCATION:  Education details: HEP Person educated: Patient Education method: Programmer, multimedia, Facilities manager, and Handouts Education comprehension: verbalized  understanding, returned demonstration, and needs further education  HOME EXERCISE PROGRAM: Access Code: [email or web address] URL: [email or web address] Date: 12/14/2023 Prepared by: Gellen April Erman Hayward  Exercises - Seated Upper Trapezius Stretch  - 1 x daily - 7 x weekly - 2 sets - 30 sec hold - Gentle Levator Scapulae Stretch  - 1 x daily - 7 x weekly - 2 sets - 30 sec hold - Corner Pec Minor Stretch  - 1 x daily - 7 x weekly - 2 sets - 30 sec hold - Doorway Pec Stretch at 60 Elevation  - 1 x daily - 7 x weekly - 2 sets - 30 sec hold - Standing Bilateral Low Shoulder Row with Anchored Resistance  - 1 x daily - 7 x weekly - 2-3 sets - 10 reps - 3-5 sec hold - Shoulder W - External Rotation  with Resistance  - 1 x daily - 7 x weekly - 2-3 sets - 10 reps - Seated Lat Pull Down with Resistance - Elbows Bent  - 1 x daily - 7 x weekly - 2-3 sets - 10 reps - Serratus Activation at Wall  - 1 x daily - 7 x weekly - 2-3 sets - 10 reps  ASSESSMENT:  CLINICAL IMPRESSION: Working on improving strength for overhead lifting and postural strength. Still needs cues to avoid scapular elevation with exercises. Still gets fatigued quickly with shoulder elevation. NDI has improved meeting LTG #4.  OBJECTIVE IMPAIRMENTS: decreased activity tolerance, decreased coordination, decreased endurance, decreased mobility, decreased ROM, decreased strength, increased fascial restrictions, increased muscle spasms, impaired flexibility, impaired UE functional use, improper body mechanics, postural dysfunction, and pain.   ACTIVITY LIMITATIONS: carrying, lifting, sleeping, bed mobility, bathing, toileting, dressing, reach over head, and hygiene/grooming  PARTICIPATION LIMITATIONS: meal prep, cleaning, laundry, community activity, occupation, and yard work  PERSONAL FACTORS: Age, Fitness, Past/current experiences, and Time since onset of injury/illness/exacerbation are also affecting patient's functional outcome.    REHAB POTENTIAL: Good  CLINICAL DECISION MAKING: Evolving/moderate complexity  EVALUATION COMPLEXITY: Moderate   GOALS: Goals reviewed with patient? Yes  SHORT TERM GOALS: Target date: 12/12/2023   Pt will be ind with initial HEP Baseline:  Goal status: MET- 12/07/23  2.  Pt will demo full shoulder ROM without pain Baseline:  Goal status: PARTIALLY MET only tightness/discomfort  3.  Pt will be independent with maintaining shoulders down and back Baseline:  Goal status: PARTIALLY MET- 12/12/23   LONG TERM GOALS: Target date: 01/02/2024   Pt will be ind with management and progression of HEP Baseline:  Goal status: INITIAL  2.  Pt will be able to lift and carry at least 20# without pain for work tasks Baseline:  Goal status: INITIAL  3.  Pt will have improved L shoulder MMT to >/=4/5 to demo increasing postural stability Baseline:  Goal status: INITIAL  4.  Pt will have improved NDI to </=18% to demo MCID Baseline:  Goal status: MET- 12/21/23    PLAN:  PT FREQUENCY: 2x/week  PT DURATION: 6 weeks  PLANNED INTERVENTIONS: 16109- PT Re-evaluation, 97110-Therapeutic exercises, 97530- Therapeutic activity, 97112- Neuromuscular re-education, 97535- Self Care, 60454- Manual therapy, G0283- Electrical stimulation (unattended), 09811- Traction (mechanical), [id number]- Ionotophoresis 4mg /ml Dexamethasone , Patient/Family education, Taping, Dry Needling, Joint mobilization, Spinal mobilization, Cryotherapy, and Moist heat  PLAN FOR NEXT SESSION: Manual/dry needling as indicated for L UT. Strengthen posterior shoulder girdle, stretch pecs/UTs, decrease UT compensation, work on shoulder ROM   Samuella Crocker, PTA 12/21/2023, 5:04 PM

## 2023-12-26 ENCOUNTER — Ambulatory Visit: Attending: Neurological Surgery

## 2023-12-26 DIAGNOSIS — M25512 Pain in left shoulder: Secondary | ICD-10-CM | POA: Diagnosis present

## 2023-12-26 DIAGNOSIS — M6281 Muscle weakness (generalized): Secondary | ICD-10-CM | POA: Diagnosis present

## 2023-12-26 DIAGNOSIS — R293 Abnormal posture: Secondary | ICD-10-CM | POA: Insufficient documentation

## 2023-12-26 DIAGNOSIS — M62838 Other muscle spasm: Secondary | ICD-10-CM | POA: Diagnosis present

## 2023-12-26 DIAGNOSIS — R2689 Other abnormalities of gait and mobility: Secondary | ICD-10-CM | POA: Diagnosis present

## 2023-12-26 NOTE — Therapy (Signed)
 OUTPATIENT PHYSICAL THERAPY CERVICAL TREATMENT   Patient Name: William Howe MRN: [id number] DOB:08/26/1990, 33 y.o., male Today's Date: 12/26/2023  END OF SESSION:  PT End of Session - 12/26/23 1613     Visit Number 8    Number of Visits 12    Date for PT Re-Evaluation 01/02/24    Authorization Type [employer] Medicaid    PT Start Time 1531    PT Stop Time 1610    PT Time Calculation (min) 39 min    Activity Tolerance Patient tolerated treatment well    Behavior During Therapy [facility] for tasks assessed/performed              History reviewed. No pertinent past medical history. Past Surgical History:  Procedure Laterality Date   HAND SURGERY     TOE SURGERY     There are no active problems to display for this patient.   PCP: Patient, No Pcp Per  REFERRING PROVIDER: Elna Haggis, MD  REFERRING DIAG: [phone] (ICD-10-CM) - Radiculopathy, cervical region  THERAPY DIAG:  Abnormal posture  Muscle weakness (generalized)  Acute pain of left shoulder  Other muscle spasm  Other abnormalities of gait and mobility  Rationale for Evaluation and Treatment: Rehabilitation  ONSET DATE: ~1-2 months ago  SUBJECTIVE:                                                                                                                                                                                                         SUBJECTIVE STATEMENT: "L shoulder has been hurting, since last Thursday." Hand dominance: Right  PERTINENT HISTORY:  Shoulder pain years ago (had a shot and exercises)  PAIN:  Are you having pain? Yes: NPRS scale: 2/10 Pain location: L anterior shoulder Pain description: Extreme tightness Aggravating factors: Work activities Relieving factors: Resting at home  PRECAUTIONS: None  RED FLAGS: None     WEIGHT BEARING RESTRICTIONS: No  FALLS:  Has patient fallen in last 6 months? 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  DIAGNOSTIC FINDINGS:  MRI not yet scheduled  PATIENT SURVEYS:  Neck Disability Index score: 14 / 50 = 28.0 %  COGNITION: Overall cognitive status: Within functional limits for tasks assessed  SENSATION: None  POSTURE: rounded shoulders, decreased thoracic kyphosis, and GH head anteriorly and superiorly oriented  PALPATION: TTP & trigger points noted in L UT, pec minor/major, suboccipitals   CERVICAL ROM:   Active ROM A/PROM (deg) eval  Flexion 31  Extension 35  Right lateral flexion 45  Left lateral flexion 35  Right rotation 80  Left rotation 70 "a little discomfort"   (Blank rows = not tested)  UPPER EXTREMITY ROM:  Active ROM Right eval Left eval L 12/12/23  Shoulder flexion 160 150 * 153- discomfort  Shoulder extension     Shoulder abduction 150 135 * 150- discomfort  Shoulder adduction     Shoulder extension 80 70* 73  Shoulder internal rotation T4 T5* T6  Shoulder external rotation C7 C7 T3  Elbow flexion     Elbow extension     Wrist flexion     Wrist extension     Wrist ulnar deviation     Wrist radial deviation     Wrist pronation     Wrist supination      (Blank rows = not tested, * = pain)  UPPER EXTREMITY MMT:  MMT Right eval Left eval  Shoulder flexion 5 4-  Shoulder extension 5 5  Shoulder abduction 5 4+  Shoulder adduction    Shoulder extension    Shoulder internal rotation 5 5  Shoulder external rotation 5 3+ *  Middle trapezius 4 3+  Lower trapezius 3 Unable to test due to pain  Elbow flexion    Elbow extension    Wrist flexion    Wrist extension    Wrist ulnar deviation    Wrist radial deviation     Wrist pronation    Wrist supination    Grip strength     (Blank rows = not tested, * = pain)  CERVICAL SPECIAL TESTS:  Spurling's test: Negative  FUNCTIONAL TESTS:  Did not assess  TREATMENT DATE:  12/26/23 UBE L3; 3 min fwd, 3 min bwd STM to anterior deltoid, bicep attachment to shoulder: IASTM with edge tool to UT, rhomboids, LS L shoulder PROM all directions  L shoulder isometrics ER,IR, ext 5x5"  12/21/23 UBE L3; 3 min fwd, 3 min bwd Seated shoulder ER blue TB 2x10 "W" blue TB 2x10 Horizontal shoulder abd blue TB 2x10 Lower trap set on wall blue TB x 10 Standing cervical retraction + shoulder flex 3# x10 Standing cervical retraction + scaption 3# x 10 Cat/cow x 10 Open books 5x B Neck Disability Index score: 4 / 50 = 8.0 %  12/19/23 UBE L4; 3 min fwd, 3 min bwd Doorway pec stretch low, mid, high x30" each UT stretch x30" R&L Seated scap depression hands on mat table 2x10 Seated shoulder ER green TB 2x10 "W" green TB 2x10 Horizontal shoulder abd green TB 2x10 Serratus activation at wall into low trap setting 2x10 Serratus activation with yellow TB shoulder flexion x10 Standing cervical retraction + shoulder flex 4# x10, 3# x10 Standing cervical retraction + scaption 4# 2x10  12/14/23 UBE L1; 3 min fwd, 3 min bwd Seated thoracic extension x10 Doorway pec stretch low, mid, high x30" each Trigger Point Dry Needling  Subsequent Treatment: Instructions provided previously at initial dry needling treatment.   Patient Verbal Consent Given: Yes Education Handout Provided: Previously Provided Muscles Treated: Bilat UT Electrical Stimulation Performed: No Treatment Response/Outcome: Decreased muscle tension  UT stretch x 30" R&L Scap depression 20# on lat pull down machine 2x10 Scap depression with  lat pull down 20# 2x10 Low Row 15# 2x10 Mid row 15# 2x10 "W" green TB 2x10 Lat pull down green TB demo to perform at home Bottoms up [employer] 5# x2 laps Serratus  anterior slide on wall 2x10  12/12/23 UBE L2.0 3 min each way UE ROM Box lifting to simulate work related activities 17lb with good body mechanics- cues needed to avoid rounding back x 20 Cabinet reaches 2lb bil shoulders x 10; 5lb x 10 bil Doorway high pec stretch 2x30" STM to L UT, upper rhomboids, LS Seated L UT stretch x 30"  12/07/23 UBE L1.0 3 min each way Assisted cervical extension with pillowcase x 10 Assisted cervical rotation with pillowcase x 10 bil Assisted thoracic ext with pillowcase x 10 Standing L shoulder flexion wall slide x 10 Standing L shoulder abduction wall slide x 10 Standing rows GTB 2x10 Standing shoulder extension GTB 2x10 Standing ER RTB x 10 R/L shoulder Low pec stretch in doorway 2x30" Supine chest stretch on 1/2 foam roll 2x30"   12/05/23 UBE L1.0 3 min each way STM to L UT ,LS ,rhomboids UT stretch 2x30" bil Levator stretch 2x30" bil Doorway pec stretch 2x30" Low pec stretch in doorway 2x30" Standing rows GTB x 10  11/21/23 See HEP below Manual therapy STM & TPR L UT Skilled assessment and palpation for TPDN Trigger Point Dry Needling  Initial Treatment: Pt instructed on Dry Needling rational, procedures, and possible side effects. Pt instructed to expect mild to moderate muscle soreness later in the day and/or into the next day.  Pt instructed in methods to reduce muscle soreness. Pt instructed to continue prescribed HEP. Patient was educated on signs and symptoms of infection and other risk factors and advised to seek medical attention should they occur.  Patient verbalized understanding of these instructions and education.   Patient Verbal Consent Given: Yes Education Handout Provided: Yes Muscles Treated: L UT Electrical Stimulation Performed: No Treatment Response/Outcome: Twitch response, improved muscle tightness                                                                                                                                    PATIENT EDUCATION:  Education details: HEP Person educated: Patient Education method: Programmer, multimedia, Facilities manager, and Handouts Education comprehension: verbalized understanding, returned demonstration, and needs further education  HOME EXERCISE PROGRAM: Access Code: [email or web address] URL: [email or web address] Date: 12/14/2023 Prepared by: Gellen April Erman Hayward  Exercises - Seated Upper Trapezius Stretch  - 1 x daily - 7 x weekly - 2 sets - 30 sec hold - Gentle Levator Scapulae Stretch  - 1 x daily - 7 x weekly - 2 sets - 30 sec hold - Corner Pec Minor Stretch  - 1 x daily - 7 x weekly - 2 sets - 30 sec hold - Doorway Pec Stretch at 60 Elevation  - 1 x daily - 7 x weekly - 2 sets - 30 sec  hold - Standing Bilateral Low Shoulder Row with Anchored Resistance  - 1 x daily - 7 x weekly - 2-3 sets - 10 reps - 3-5 sec hold - Shoulder W - External Rotation with Resistance  - 1 x daily - 7 x weekly - 2-3 sets - 10 reps - Seated Lat Pull Down with Resistance - Elbows Bent  - 1 x daily - 7 x weekly - 2-3 sets - 10 reps - Serratus Activation at Wall  - 1 x daily - 7 x weekly - 2-3 sets - 10 reps  ASSESSMENT:  CLINICAL IMPRESSION: Pt reports his L shoulder flared up since this past Thursday. Focused on pain management and decreasing soft tissue restrictions. Pain improved post session. Would recommend continued skilled PT.  OBJECTIVE IMPAIRMENTS: decreased activity tolerance, decreased coordination, decreased endurance, decreased mobility, decreased ROM, decreased strength, increased fascial restrictions, increased muscle spasms, impaired flexibility, impaired UE functional use, improper body mechanics, postural dysfunction, and pain.   ACTIVITY LIMITATIONS: carrying, lifting, sleeping, bed mobility, bathing, toileting, dressing, reach over head, and hygiene/grooming  PARTICIPATION LIMITATIONS: meal prep, cleaning, laundry, community activity, occupation, and yard  work  PERSONAL FACTORS: Age, Fitness, Past/current experiences, and Time since onset of injury/illness/exacerbation are also affecting patient's functional outcome.   REHAB POTENTIAL: Good  CLINICAL DECISION MAKING: Evolving/moderate complexity  EVALUATION COMPLEXITY: Moderate   GOALS: Goals reviewed with patient? Yes  SHORT TERM GOALS: Target date: 12/12/2023   Pt will be ind with initial HEP Baseline:  Goal status: MET- 12/07/23  2.  Pt will demo full shoulder ROM without pain Baseline:  Goal status: PARTIALLY MET only tightness/discomfort  3.  Pt will be independent with maintaining shoulders down and back Baseline:  Goal status: PARTIALLY MET- 12/12/23   LONG TERM GOALS: Target date: 01/02/2024   Pt will be ind with management and progression of HEP Baseline:  Goal status: INITIAL  2.  Pt will be able to lift and carry at least 20# without pain for work tasks Baseline:  Goal status: INITIAL  3.  Pt will have improved L shoulder MMT to >/=4/5 to demo increasing postural stability Baseline:  Goal status: INITIAL  4.  Pt will have improved NDI to </=18% to demo MCID Baseline:  Goal status: MET- 12/21/23    PLAN:  PT FREQUENCY: 2x/week  PT DURATION: 6 weeks  PLANNED INTERVENTIONS: 16109- PT Re-evaluation, 97110-Therapeutic exercises, 97530- Therapeutic activity, 97112- Neuromuscular re-education, 97535- Self Care, 60454- Manual therapy, G0283- Electrical stimulation (unattended), 09811- Traction (mechanical), [id number]- Ionotophoresis 4mg /ml Dexamethasone , Patient/Family education, Taping, Dry Needling, Joint mobilization, Spinal mobilization, Cryotherapy, and Moist heat  PLAN FOR NEXT SESSION: Manual/dry needling as indicated for L UT. Strengthen posterior shoulder girdle, stretch pecs/UTs, decrease UT compensation, work on shoulder ROM   Samuella Crocker, PTA 12/26/2023, 5:05 PM

## 2023-12-27 ENCOUNTER — Ambulatory Visit (INDEPENDENT_AMBULATORY_CARE_PROVIDER_SITE_OTHER): Admitting: Family

## 2023-12-27 VITALS — BP 122/84 | HR 81 | Ht 74.0 in | Wt 281.0 lb

## 2023-12-27 DIAGNOSIS — M542 Cervicalgia: Secondary | ICD-10-CM | POA: Diagnosis not present

## 2023-12-27 DIAGNOSIS — M5412 Radiculopathy, cervical region: Secondary | ICD-10-CM | POA: Diagnosis not present

## 2023-12-27 DIAGNOSIS — R7989 Other specified abnormal findings of blood chemistry: Secondary | ICD-10-CM

## 2023-12-27 MED ORDER — METHOCARBAMOL 500 MG PO TABS: 500.0000 mg | ORAL_TABLET | Freq: Three times a day (TID) | ORAL | 0 refills | Status: DC | PRN

## 2023-12-27 NOTE — Progress Notes (Signed)
 William Howe is a 33 y.o. male with the following history as recorded in [employer]:  There are no active problems to display for this patient.   Current Outpatient Medications  Medication Sig Dispense Refill   ibuprofen  (ADVIL ,MOTRIN ) 200 MG tablet Take 600 mg by mouth every 6 (six) hours as needed. For pain     lidocaine  (LIDODERM ) 5 % Place 1 patch onto the skin daily. Remove & Discard patch within 12 hours or as directed by MD 15 patch 0   methocarbamol  (ROBAXIN ) 500 MG tablet Take 1 tablet (500 mg total) by mouth every 8 (eight) hours as needed for muscle spasms. 20 tablet 0   naproxen  (NAPROSYN ) 500 MG tablet Take 1 tablet (500 mg total) by mouth 2 (two) times daily. (Patient not taking: Reported on 12/27/2023) 30 tablet 0   No current facility-administered medications for this visit.    Allergies: Patient has no known allergies.  No past medical history on file.  Past Surgical History:  Procedure Laterality Date   HAND SURGERY     TOE SURGERY      No family history on file.  Social History   Tobacco Use   Smoking status: Never   Smokeless tobacco: Never  Substance Use Topics   Alcohol use: No    Subjective:   Accompanied by his mother; presents today as a new patient; Has been struggling with chronic neck pain and is currently under the care of PT/ neurosurgery;  He is concerned about the status of his FMLA paperwork that was done by his neurosurgery provider- he is concerned that it was not completed as he requested and wonders if our office can complete for him; Would also like to get refill on his muscle relaxer; will be extending his PT for another 2 weeks;  Did have MRI recently and showed mild bulging disc at C6-7; per patient, neurosurgeon did not feel that any surgery was warranted;   Objective:  Vitals:   12/27/23 1519  BP: 122/84  Pulse: 81  SpO2: 97%  Weight: 281 lb (127.5 kg)  Height: 6\' 2"  (1.88 m)    General: Well developed, well nourished, in no  acute distress  Skin : Warm and dry.  Head: Normocephalic and atraumatic  Eyes: Sclera and conjunctiva clear; pupils round and reactive to light; extraocular movements intact  Ears: External normal; canals clear; tympanic membranes normal  Oropharynx: Pink, supple. No suspicious lesions  Neck: Supple without thyromegaly, adenopathy  Lungs: Respirations unlabored; clear to auscultation bilaterally without wheeze, rales, rhonchi  CVS exam: normal rate and regular rhythm.  Neurologic: Alert and oriented; speech intact; face symmetrical; moves all extremities well; CNII-XII intact without focal deficit   Assessment:  1. Cervical radiculopathy   2. Neck pain   3. Elevated LFTs     Plan:  Will give short term refill on muscle relaxer as requested by patient; he is encouraged to let his neurosurgeon know that pain is not improving as hoped/ will also need to speak to his neurosurgeon about getting corrections to his FMLA; Will update repeat LFTs today; patient is encourage to follow up for CPE in about 2 months.   Return in about 2 months (around 02/26/2024) for CPE.  Orders Placed This Encounter  Procedures   Hepatic function panel    Requested Prescriptions   Signed Prescriptions Disp Refills   methocarbamol  (ROBAXIN ) 500 MG tablet 20 tablet 0    Sig: Take 1 tablet (500 mg total) by mouth every 8 (  eight) hours as needed for muscle spasms.

## 2023-12-28 ENCOUNTER — Other Ambulatory Visit: Payer: Self-pay | Admitting: Family

## 2023-12-28 DIAGNOSIS — R7989 Other specified abnormal findings of blood chemistry: Secondary | ICD-10-CM

## 2023-12-28 LAB — HEPATIC FUNCTION PANEL
ALT: 61 U/L — ABNORMAL HIGH (ref 0–53)
AST: 25 U/L (ref 0–37)
Albumin: 4.8 g/dL (ref 3.5–5.2)
Alkaline Phosphatase: 71 U/L (ref 39–117)
Bilirubin, Direct: 0.1 mg/dL (ref 0.0–0.3)
Total Bilirubin: 0.7 mg/dL (ref 0.2–1.2)
Total Protein: 7.8 g/dL (ref 6.0–8.3)

## 2023-12-29 ENCOUNTER — Ambulatory Visit

## 2024-01-03 ENCOUNTER — Ambulatory Visit

## 2024-01-03 ENCOUNTER — Ambulatory Visit: Admitting: Physician Assistant

## 2024-01-03 DIAGNOSIS — R2689 Other abnormalities of gait and mobility: Secondary | ICD-10-CM

## 2024-01-03 DIAGNOSIS — R293 Abnormal posture: Secondary | ICD-10-CM | POA: Diagnosis not present

## 2024-01-03 DIAGNOSIS — M62838 Other muscle spasm: Secondary | ICD-10-CM

## 2024-01-03 DIAGNOSIS — M25512 Pain in left shoulder: Secondary | ICD-10-CM

## 2024-01-03 DIAGNOSIS — M6281 Muscle weakness (generalized): Secondary | ICD-10-CM

## 2024-01-03 NOTE — Therapy (Signed)
 OUTPATIENT PHYSICAL THERAPY CERVICAL TREATMENT   Patient Name: William Howe MRN: [id number] DOB:April 03, 1991, 33 y.o., male Today's Date: 01/03/2024  END OF SESSION:  PT End of Session - 01/03/24 1531     Visit Number 9    Number of Visits 12    Date for PT Re-Evaluation 02/14/24    Authorization Type [employer]    PT Start Time 1447    PT Stop Time 1529    PT Time Calculation (min) 42 min    Activity Tolerance Patient tolerated treatment well    Behavior During Therapy [facility] for tasks assessed/performed               History reviewed. No pertinent past medical history. Past Surgical History:  Procedure Laterality Date   HAND SURGERY     TOE SURGERY     There are no active problems to display for this patient.   PCP: Adra Alanis, FNP  REFERRING PROVIDER: Elna Haggis, MD  REFERRING DIAG: [id number] (ICD-10-CM) - Radiculopathy, cervical region  THERAPY DIAG:  Abnormal posture  Muscle weakness (generalized)  Acute pain of left shoulder  Other muscle spasm  Other abnormalities of gait and mobility  Rationale for Evaluation and Treatment: Rehabilitation  ONSET DATE: ~1-2 months ago  SUBJECTIVE:                                                                                                                                                                                                         SUBJECTIVE STATEMENT: Pt reports he still has flare ups but hasn't had one since Friday.  Hand dominance: Right  PERTINENT HISTORY:  Shoulder pain years ago (had a shot and exercises)  PAIN:  Are you having pain? Yes: NPRS scale: 2/10 Pain location: L anterior shoulder Pain description: Extreme tightness Aggravating factors: Work activities Relieving factors: Resting at home  PRECAUTIONS: None  RED FLAGS: None     WEIGHT BEARING RESTRICTIONS: No  FALLS:  Has patient fallen in last 6 months? 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  DIAGNOSTIC FINDINGS:  MRI not yet scheduled  PATIENT SURVEYS:  Neck Disability Index score: 14 / 50 = 28.0 %  COGNITION: Overall cognitive status:  Within functional limits for tasks assessed  SENSATION: None  POSTURE: rounded shoulders, decreased thoracic kyphosis, and GH head anteriorly and superiorly oriented  PALPATION: TTP & trigger points noted in L UT, pec minor/major, suboccipitals   CERVICAL ROM:   Active ROM A/PROM (deg) eval  Flexion 31  Extension 35  Right lateral flexion 45  Left lateral flexion 35  Right rotation 80  Left rotation 70 "a little discomfort"   (Blank rows = not tested)  UPPER EXTREMITY ROM:  Active ROM Right eval Left eval L 12/12/23  Shoulder flexion 160 150 * 153- discomfort  Shoulder extension     Shoulder abduction 150 135 * 150- discomfort  Shoulder adduction     Shoulder extension 80 70* 73  Shoulder internal rotation T4 T5* T6  Shoulder external rotation C7 C7 T3  Elbow flexion     Elbow extension     Wrist flexion     Wrist extension     Wrist ulnar deviation     Wrist radial deviation     Wrist pronation     Wrist supination      (Blank rows = not tested, * = pain)  UPPER EXTREMITY MMT:  MMT Right eval Left eval Left 01/03/24  Shoulder flexion 5 4- 4+  Shoulder extension 5 5   Shoulder abduction 5 4+ 4+shakiness  Shoulder adduction     Shoulder extension     Shoulder internal rotation 5 5 4+  Shoulder external rotation 5 3+ * 4  Middle trapezius 4 3+   Lower trapezius 3 Unable to test due to pain 4  Elbow flexion     Elbow extension     Wrist flexion     Wrist  extension     Wrist ulnar deviation     Wrist radial deviation     Wrist pronation     Wrist supination     Grip strength      (Blank rows = not tested, * = pain)  CERVICAL SPECIAL TESTS:  Spurling's test: Negative  FUNCTIONAL TESTS:  Did not assess  TREATMENT DATE:  01/03/24 UBE L3; 3 min fwd, 3 min bwd Reassessment of goals Unilateral reaches 4lb x 10 R and L UE Lateral raises 3lb x 10  Deadlifts 15lb x 10  Unilateral farmer carry 15lb 2x90 ft each DTM to L UT   12/26/23 UBE L3; 3 min fwd, 3 min bwd STM to anterior deltoid, bicep attachment to shoulder: IASTM with edge tool to UT, rhomboids, LS L shoulder PROM all directions  L shoulder isometrics ER,IR, ext 5x5"  12/21/23 UBE L3; 3 min fwd, 3 min bwd Seated shoulder ER blue TB 2x10 "W" blue TB 2x10 Horizontal shoulder abd blue TB 2x10 Lower trap set on wall blue TB x 10 Standing cervical retraction + shoulder flex 3# x10 Standing cervical retraction + scaption 3# x 10 Cat/cow x 10 Open books 5x B Neck Disability Index score: 4 / 50 = 8.0 %  12/19/23 UBE L4; 3 min fwd, 3 min bwd Doorway pec stretch low, mid, high x30" each UT stretch x30" R&L Seated scap depression hands on mat table 2x10 Seated shoulder ER green TB 2x10 "W" green TB 2x10 Horizontal shoulder abd green TB 2x10 Serratus activation at wall into low trap setting 2x10 Serratus activation with yellow TB shoulder flexion x10 Standing cervical retraction + shoulder flex 4# x10, 3# x10 Standing cervical retraction + scaption 4# 2x10  12/14/23 UBE L1; 3 min fwd, 3  min bwd Seated thoracic extension x10 Doorway pec stretch low, mid, high x30" each Trigger Point Dry Needling  Subsequent Treatment: Instructions provided previously at initial dry needling treatment.   Patient Verbal Consent Given: Yes Education Handout Provided: Previously Provided Muscles Treated: Bilat UT Electrical Stimulation Performed: No Treatment Response/Outcome: Decreased  muscle tension  UT stretch x 30" R&L Scap depression 20# on lat pull down machine 2x10 Scap depression with lat pull down 20# 2x10 Low Row 15# 2x10 Mid row 15# 2x10 "W" green TB 2x10 Lat pull down green TB demo to perform at home Bottoms up [employer] 5# x2 laps Serratus anterior slide on wall 2x10  12/12/23 UBE L2.0 3 min each way UE ROM Box lifting to simulate work related activities 17lb with good body mechanics- cues needed to avoid rounding back x 20 Cabinet reaches 2lb bil shoulders x 10; 5lb x 10 bil Doorway high pec stretch 2x30" STM to L UT, upper rhomboids, LS Seated L UT stretch x 30"  12/07/23 UBE L1.0 3 min each way Assisted cervical extension with pillowcase x 10 Assisted cervical rotation with pillowcase x 10 bil Assisted thoracic ext with pillowcase x 10 Standing L shoulder flexion wall slide x 10 Standing L shoulder abduction wall slide x 10 Standing rows GTB 2x10 Standing shoulder extension GTB 2x10 Standing ER RTB x 10 R/L shoulder Low pec stretch in doorway 2x30" Supine chest stretch on 1/2 foam roll 2x30"   12/05/23 UBE L1.0 3 min each way STM to L UT ,LS ,rhomboids UT stretch 2x30" bil Levator stretch 2x30" bil Doorway pec stretch 2x30" Low pec stretch in doorway 2x30" Standing rows GTB x 10  11/21/23 See HEP below Manual therapy STM & TPR L UT Skilled assessment and palpation for TPDN Trigger Point Dry Needling  Initial Treatment: Pt instructed on Dry Needling rational, procedures, and possible side effects. Pt instructed to expect mild to moderate muscle soreness later in the day and/or into the next day.  Pt instructed in methods to reduce muscle soreness. Pt instructed to continue prescribed HEP. Patient was educated on signs and symptoms of infection and other risk factors and advised to seek medical attention should they occur.  Patient verbalized understanding of these instructions and education.   Patient Verbal Consent Given:  Yes Education Handout Provided: Yes Muscles Treated: L UT Electrical Stimulation Performed: No Treatment Response/Outcome: Twitch response, improved muscle tightness                                                                                                                                   PATIENT EDUCATION:  Education details: HEP Person educated: Patient Education method: Programmer, multimedia, Facilities manager, and Handouts Education comprehension: verbalized understanding, returned demonstration, and needs further education  HOME EXERCISE PROGRAM: Access Code: [email or web address] URL: [email or web address] Date: 12/14/2023 Prepared by: Gellen April Erman Hayward  Exercises - Seated Upper Trapezius Stretch  - 1 x daily - 7 x  weekly - 2 sets - 30 sec hold - Gentle Levator Scapulae Stretch  - 1 x daily - 7 x weekly - 2 sets - 30 sec hold - Corner Pec Minor Stretch  - 1 x daily - 7 x weekly - 2 sets - 30 sec hold - Doorway Pec Stretch at 60 Elevation  - 1 x daily - 7 x weekly - 2 sets - 30 sec hold - Standing Bilateral Low Shoulder Row with Anchored Resistance  - 1 x daily - 7 x weekly - 2-3 sets - 10 reps - 3-5 sec hold - Shoulder W - External Rotation with Resistance  - 1 x daily - 7 x weekly - 2-3 sets - 10 reps - Seated Lat Pull Down with Resistance - Elbows Bent  - 1 x daily - 7 x weekly - 2-3 sets - 10 reps - Serratus Activation at Wall  - 1 x daily - 7 x weekly - 2-3 sets - 10 reps  ASSESSMENT:  CLINICAL IMPRESSION: Pt has improved as far as strength and his ability to lift an carry items. His biggest issue is that he cannot carry things for longer periods of time which is required for his job. Continued working on work related activities today with good response. After the lifting his L UT became tight again so we did some manual to calm it down. He would continue to benefit from skilled therapy to address deficits.   OBJECTIVE IMPAIRMENTS: decreased activity tolerance,  decreased coordination, decreased endurance, decreased mobility, decreased ROM, decreased strength, increased fascial restrictions, increased muscle spasms, impaired flexibility, impaired UE functional use, improper body mechanics, postural dysfunction, and pain.   ACTIVITY LIMITATIONS: carrying, lifting, sleeping, bed mobility, bathing, toileting, dressing, reach over head, and hygiene/grooming  PARTICIPATION LIMITATIONS: meal prep, cleaning, laundry, community activity, occupation, and yard work  PERSONAL FACTORS: Age, Fitness, Past/current experiences, and Time since onset of injury/illness/exacerbation are also affecting patient's functional outcome.   REHAB POTENTIAL: Good  CLINICAL DECISION MAKING: Evolving/moderate complexity  EVALUATION COMPLEXITY: Moderate   GOALS: Goals reviewed with patient? Yes  SHORT TERM GOALS: Target date: 12/12/2023   Pt will be ind with initial HEP Baseline:  Goal status: MET- 12/07/23  2.  Pt will demo full shoulder ROM without pain Baseline:  Goal status: MET- discomfort not pain 01/03/24  3.  Pt will be independent with maintaining shoulders down and back Baseline:  Goal status: MET- 01/03/24   LONG TERM GOALS: Target date: 01/02/2024 extended to 02/14/24   Pt will be ind with management and progression of HEP Baseline:  Goal status: INITIAL  2.  Pt will be able to lift and carry at least 20# without pain for work tasks Baseline:  Goal status: PARTIALLY MET- 01/03/24   3.  Pt will have improved L shoulder MMT to >/=4/5 to demo increasing postural stability Baseline:  Goal status: PARTIALLY MET 01/03/24  4.  Pt will have improved NDI to </=18% to demo MCID Baseline:  Goal status: MET- 12/21/23    PLAN:  PT FREQUENCY: 2x/week  PT DURATION: 6 weeks  PLANNED INTERVENTIONS: 97164- PT Re-evaluation, 97110-Therapeutic exercises, 97530- Therapeutic activity, 97112- Neuromuscular re-education, 97535- Self Care, 11914- Manual therapy,  [id number]- Electrical stimulation (unattended), 78295- Traction (mechanical), [id number]- Ionotophoresis 4mg /ml Dexamethasone , Patient/Family education, Taping, Dry Needling, Joint mobilization, Spinal mobilization, Cryotherapy, and Moist heat  PLAN FOR NEXT SESSION: Manual/dry needling as indicated for L UT. Strengthen posterior shoulder girdle, stretch pecs/UTs, decrease UT compensation, work on shoulder ROM  Aggie Douse L Ennifer Harston, PTA 01/03/2024, 3:31 PM

## 2024-01-04 NOTE — Addendum Note (Signed)
 Addended by: Koleen Perna L on: 01/04/2024 07:58 AM   Modules accepted: Orders

## 2024-01-09 ENCOUNTER — Ambulatory Visit: Admitting: Physical Therapy

## 2024-01-09 DIAGNOSIS — R293 Abnormal posture: Secondary | ICD-10-CM | POA: Diagnosis not present

## 2024-01-09 DIAGNOSIS — M25512 Pain in left shoulder: Secondary | ICD-10-CM

## 2024-01-09 DIAGNOSIS — M6281 Muscle weakness (generalized): Secondary | ICD-10-CM

## 2024-01-09 DIAGNOSIS — M62838 Other muscle spasm: Secondary | ICD-10-CM

## 2024-01-09 DIAGNOSIS — R2689 Other abnormalities of gait and mobility: Secondary | ICD-10-CM

## 2024-01-09 NOTE — Therapy (Signed)
 OUTPATIENT PHYSICAL THERAPY CERVICAL TREATMENT   Patient Name: William Howe MRN: [id number] DOB:06-08-1991, 33 y.o., male Today's Date: 01/09/2024  END OF SESSION:  PT End of Session - 01/09/24 1531     Visit Number 10    Number of Visits 18    Date for PT Re-Evaluation 02/14/24    Authorization Type [employer]    PT Start Time 1531    PT Stop Time 1610    PT Time Calculation (min) 39 min    Activity Tolerance Patient tolerated treatment well    Behavior During Therapy [facility] for tasks assessed/performed             No past medical history on file. Past Surgical History:  Procedure Laterality Date   HAND SURGERY     TOE SURGERY     There are no active problems to display for this patient.   PCP: Adra Alanis, FNP  REFERRING PROVIDER: Elna Haggis, MD  REFERRING DIAG: [phone] (ICD-10-CM) - Radiculopathy, cervical region  THERAPY DIAG:  Abnormal posture  Muscle weakness (generalized)  Acute pain of left shoulder  Other muscle spasm  Other abnormalities of gait and mobility  Rationale for Evaluation and Treatment: Rehabilitation  ONSET DATE: ~1-2 months ago  SUBJECTIVE:                                                                                                                                                                                                         SUBJECTIVE STATEMENT: Hasn't had as many appointments but has been on the waitlist. Reports he has noticed a little bit of improvement. Worked for longer period (~4 hours) before pain set in.  Hand dominance: Right  PERTINENT HISTORY:  Shoulder pain years ago (had a shot and exercises)  PAIN:  Are you having pain? Yes: NPRS scale: 2/10 Pain location: L anterior shoulder Pain description: Extreme tightness Aggravating factors: Work activities Relieving factors: Resting at home  PRECAUTIONS: None  RED FLAGS: None     WEIGHT BEARING RESTRICTIONS: No  FALLS:  Has patient  fallen in last 6 months? 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  DIAGNOSTIC FINDINGS:  MRI not yet scheduled  PATIENT SURVEYS:  Neck  Disability Index score: 14 / 50 = 28.0 %  COGNITION: Overall cognitive status: Within functional limits for tasks assessed  SENSATION: None  POSTURE: rounded shoulders, decreased thoracic kyphosis, and GH head anteriorly and superiorly oriented  PALPATION: TTP & trigger points noted in L UT, pec minor/major, suboccipitals   CERVICAL ROM:   Active ROM A/PROM (deg) eval  Flexion 31  Extension 35  Right lateral flexion 45  Left lateral flexion 35  Right rotation 80  Left rotation 70 "a little discomfort"   (Blank rows = not tested)  UPPER EXTREMITY ROM:  Active ROM Right eval Left eval L 12/12/23  Shoulder flexion 160 150 * 153- discomfort  Shoulder extension     Shoulder abduction 150 135 * 150- discomfort  Shoulder adduction     Shoulder extension 80 70* 73  Shoulder internal rotation T4 T5* T6  Shoulder external rotation C7 C7 T3  Elbow flexion     Elbow extension     Wrist flexion     Wrist extension     Wrist ulnar deviation     Wrist radial deviation     Wrist pronation     Wrist supination      (Blank rows = not tested, * = pain)  UPPER EXTREMITY MMT:  MMT Right eval Left eval Left 01/03/24  Shoulder flexion 5 4- 4+  Shoulder extension 5 5   Shoulder abduction 5 4+ 4+shakiness  Shoulder adduction     Shoulder extension     Shoulder internal rotation 5 5 4+  Shoulder external rotation 5 3+ * 4  Middle trapezius 4 3+   Lower trapezius 3 Unable to test  due to pain 4  Elbow flexion     Elbow extension     Wrist flexion     Wrist extension     Wrist ulnar deviation     Wrist radial deviation     Wrist pronation     Wrist supination     Grip strength      (Blank rows = not tested, * = pain)  CERVICAL SPECIAL TESTS:  Spurling's test: Negative  FUNCTIONAL TESTS:  Did not assess  TREATMENT DATE:  01/09/24 UBE L4; 3 min fwd, 3 min bwd Standing shoulder flexion red TB 2x10 Standing shoulder abd red TB 2x10 Standing flexion and then abd overhead press red TB 2x10 Bodyblade L shoulder horizontal abd/add x30" Bodyblade L shoulder ER/IR with 0 deg abd x30" Bodyblade L shoulder flexion/ext x30" Bodyblade bilat UE overhead flexion 2x30" BATCA Low Row 25# 2x10 BATCA Mid Row 25# 2x10 BATCA lat pull down 20# 2x10 BATCA on lat pull down machine holding 35#, scapular depression 2x10  01/03/24 UBE L3; 3 min fwd, 3 min bwd Reassessment of goals Unilateral reaches 4lb x 10 R and L UE Lateral raises 3lb x 10  Deadlifts 15lb x 10  Unilateral farmer carry 15lb 2x90 ft each DTM to L UT   12/26/23 UBE L3; 3 min fwd, 3 min bwd STM to anterior deltoid, bicep attachment to shoulder: IASTM with edge tool to UT, rhomboids, LS L shoulder PROM all directions  L shoulder isometrics ER,IR, ext 5x5"  12/21/23 UBE L3; 3 min fwd, 3 min bwd Seated shoulder ER blue TB 2x10 "W" blue TB 2x10 Horizontal shoulder abd blue TB 2x10 Lower trap set on wall blue TB x 10 Standing cervical retraction + shoulder flex 3# x10 Standing cervical retraction + scaption 3# x 10 Cat/cow x 10 Open books 5x  B Neck Disability Index score: 4 / 50 = 8.0 %  12/19/23 UBE L4; 3 min fwd, 3 min bwd Doorway pec stretch low, mid, high x30" each UT stretch x30" R&L Seated scap depression hands on mat table 2x10 Seated shoulder ER green TB 2x10 "W" green TB 2x10 Horizontal shoulder abd green TB 2x10 Serratus activation at wall into low trap setting 2x10 Serratus activation  with yellow TB shoulder flexion x10 Standing cervical retraction + shoulder flex 4# x10, 3# x10 Standing cervical retraction + scaption 4# 2x10  12/14/23 UBE L1; 3 min fwd, 3 min bwd Seated thoracic extension x10 Doorway pec stretch low, mid, high x30" each Trigger Point Dry Needling  Subsequent Treatment: Instructions provided previously at initial dry needling treatment.   Patient Verbal Consent Given: Yes Education Handout Provided: Previously Provided Muscles Treated: Bilat UT Electrical Stimulation Performed: No Treatment Response/Outcome: Decreased muscle tension  UT stretch x 30" R&L Scap depression 20# on lat pull down machine 2x10 Scap depression with lat pull down 20# 2x10 Low Row 15# 2x10 Mid row 15# 2x10 "W" green TB 2x10 Lat pull down green TB demo to perform at home Bottoms up [employer] 5# x2 laps Serratus anterior slide on wall 2x10  12/12/23 UBE L2.0 3 min each way UE ROM Box lifting to simulate work related activities 17lb with good body mechanics- cues needed to avoid rounding back x 20 Cabinet reaches 2lb bil shoulders x 10; 5lb x 10 bil Doorway high pec stretch 2x30" STM to L UT, upper rhomboids, LS Seated L UT stretch x 30"  12/07/23 UBE L1.0 3 min each way Assisted cervical extension with pillowcase x 10 Assisted cervical rotation with pillowcase x 10 bil Assisted thoracic ext with pillowcase x 10 Standing L shoulder flexion wall slide x 10 Standing L shoulder abduction wall slide x 10 Standing rows GTB 2x10 Standing shoulder extension GTB 2x10 Standing ER RTB x 10 R/L shoulder Low pec stretch in doorway 2x30" Supine chest stretch on 1/2 foam roll 2x30"    PATIENT EDUCATION:  Education details: HEP Person educated: Patient Education method: Programmer, multimedia, Facilities manager, and Handouts Education comprehension: verbalized understanding, returned demonstration, and needs further education  HOME EXERCISE PROGRAM: Access Code: [email or web address] URL:  [email or web address] Date: 12/14/2023 Prepared by: Lasonja Lakins April Erman Hayward  Exercises - Seated Upper Trapezius Stretch  - 1 x daily - 7 x weekly - 2 sets - 30 sec hold - Gentle Levator Scapulae Stretch  - 1 x daily - 7 x weekly - 2 sets - 30 sec hold - Corner Pec Minor Stretch  - 1 x daily - 7 x weekly - 2 sets - 30 sec hold - Doorway Pec Stretch at 60 Elevation  - 1 x daily - 7 x weekly - 2 sets - 30 sec hold - Standing Bilateral Low Shoulder Row with Anchored Resistance  - 1 x daily - 7 x weekly - 2-3 sets - 10 reps - 3-5 sec hold - Shoulder W - External Rotation with Resistance  - 1 x daily - 7 x weekly - 2-3 sets - 10 reps - Seated Lat Pull Down with Resistance - Elbows Bent  - 1 x daily - 7 x weekly - 2-3 sets - 10 reps - Serratus Activation at Wall  - 1 x daily - 7 x weekly - 2-3 sets - 10 reps  ASSESSMENT:  CLINICAL IMPRESSION: Treatment focused on improving pt's shoulder and scapular strength/endurance -- especially with overhead lifting. Trying to improve scapular  depression to decrease UT spasm. Able to tolerate increased weight on machines.   OBJECTIVE IMPAIRMENTS: decreased activity tolerance, decreased coordination, decreased endurance, decreased mobility, decreased ROM, decreased strength, increased fascial restrictions, increased muscle spasms, impaired flexibility, impaired UE functional use, improper body mechanics, postural dysfunction, and pain.   ACTIVITY LIMITATIONS: carrying, lifting, sleeping, bed mobility, bathing, toileting, dressing, reach over head, and hygiene/grooming  PARTICIPATION LIMITATIONS: meal prep, cleaning, laundry, community activity, occupation, and yard work  PERSONAL FACTORS: Age, Fitness, Past/current experiences, and Time since onset of injury/illness/exacerbation are also affecting patient's functional outcome.   REHAB POTENTIAL: Good  CLINICAL DECISION MAKING: Evolving/moderate complexity  EVALUATION COMPLEXITY:  Moderate   GOALS: Goals reviewed with patient? Yes  SHORT TERM GOALS: Target date: 12/12/2023   Pt will be ind with initial HEP Baseline:  Goal status: MET- 12/07/23  2.  Pt will demo full shoulder ROM without pain Baseline:  Goal status: MET- discomfort not pain 01/03/24  3.  Pt will be independent with maintaining shoulders down and back Baseline:  Goal status: MET- 01/03/24   LONG TERM GOALS: Target date: 01/02/2024 extended to 02/14/24   Pt will be ind with management and progression of HEP Baseline:  Goal status: INITIAL  2.  Pt will be able to lift and carry at least 20# without pain for work tasks Baseline:  Goal status: PARTIALLY MET- 01/03/24   3.  Pt will have improved L shoulder MMT to >/=4/5 to demo increasing postural stability Baseline:  Goal status: PARTIALLY MET 01/03/24  4.  Pt will have improved NDI to </=18% to demo MCID Baseline:  Goal status: MET- 12/21/23    PLAN:  PT FREQUENCY: 2x/week  PT DURATION: 6 weeks  PLANNED INTERVENTIONS: 97164- PT Re-evaluation, 97110-Therapeutic exercises, 97530- Therapeutic activity, 97112- Neuromuscular re-education, 97535- Self Care, 16109- Manual therapy, G0283- Electrical stimulation (unattended), 60454- Traction (mechanical), [id number]- Ionotophoresis 4mg /ml Dexamethasone , Patient/Family education, Taping, Dry Needling, Joint mobilization, Spinal mobilization, Cryotherapy, and Moist heat  PLAN FOR NEXT SESSION: Manual/dry needling as indicated for L UT. Work on lifting and endurance. Strengthen posterior shoulder girdle, stretch pecs/UTs, decrease UT compensation, work on shoulder ROM   Edmund Holcomb April Ma L Nollie Terlizzi, PT, DPT 01/09/2024, 3:31 PM

## 2024-01-10 ENCOUNTER — Ambulatory Visit (HOSPITAL_BASED_OUTPATIENT_CLINIC_OR_DEPARTMENT_OTHER): Admission: RE | Admit: 2024-01-10 | Source: Ambulatory Visit

## 2024-01-17 ENCOUNTER — Ambulatory Visit (HOSPITAL_BASED_OUTPATIENT_CLINIC_OR_DEPARTMENT_OTHER)

## 2024-01-18 ENCOUNTER — Ambulatory Visit (HOSPITAL_BASED_OUTPATIENT_CLINIC_OR_DEPARTMENT_OTHER)
Admission: RE | Admit: 2024-01-18 | Discharge: 2024-01-18 | Disposition: A | Discharge status: Home / Self Care | Source: Ambulatory Visit | Attending: Family | Admitting: Family

## 2024-01-18 DIAGNOSIS — R7989 Other specified abnormal findings of blood chemistry: Secondary | ICD-10-CM | POA: Diagnosis present

## 2024-01-19 ENCOUNTER — Ambulatory Visit: Payer: Self-pay | Admitting: Family

## 2024-01-20 ENCOUNTER — Ambulatory Visit (INDEPENDENT_AMBULATORY_CARE_PROVIDER_SITE_OTHER): Admitting: Family

## 2024-01-20 ENCOUNTER — Other Ambulatory Visit: Payer: Self-pay | Admitting: Family

## 2024-01-20 ENCOUNTER — Encounter: Payer: Self-pay | Admitting: Family

## 2024-01-20 VITALS — BP 122/74 | HR 91 | Ht 74.0 in | Wt 281.0 lb

## 2024-01-20 DIAGNOSIS — M542 Cervicalgia: Secondary | ICD-10-CM

## 2024-01-20 MED ORDER — METHYLPREDNISOLONE 4 MG PO TBPK: ORAL_TABLET | ORAL | 0 refills | Status: DC

## 2024-01-20 NOTE — Progress Notes (Signed)
 Rodrecus Belsky is a 33 y.o. male with the following history as recorded in [employer]:  There are no active problems to display for this patient.   Current Outpatient Medications  Medication Sig Dispense Refill   ibuprofen  (ADVIL ,MOTRIN ) 200 MG tablet Take 600 mg by mouth every 6 (six) hours as needed. For pain     lidocaine  (LIDODERM ) 5 % Place 1 patch onto the skin daily. Remove & Discard patch within 12 hours or as directed by MD 15 patch 0   methylPREDNISolone  (MEDROL  DOSEPAK) 4 MG TBPK tablet Taper as directed 21 tablet 0   naproxen  (NAPROSYN ) 500 MG tablet Take 1 tablet (500 mg total) by mouth 2 (two) times daily. 30 tablet 0   methocarbamol  (ROBAXIN ) 500 MG tablet Take 1 tablet (500 mg total) by mouth every 8 (eight) hours as needed for muscle spasms. (Patient not taking: Reported on 01/20/2024) 20 tablet 0   No current facility-administered medications for this visit.    Allergies: Patient has no known allergies.  No past medical history on file.  Past Surgical History:  Procedure Laterality Date   HAND SURGERY     TOE SURGERY      No family history on file.  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  Head: Normocephalic and atraumatic  Eyes: Sclera and conjunctiva clear; pupils round and reactive to light; extraocular movements intact  Ears: External normal; canals clear; tympanic membranes normal  Oropharynx: Pink, supple. No suspicious lesions  Neck: Supple without thyromegaly, adenopathy  Lungs: Respirations unlabored;  Musculoskeletal: No deformities; patient holds his left arm in cradled position;   Extremities: No edema, cyanosis, clubbing  Vessels: Symmetric bilaterally  Neurologic: Alert and oriented; speech intact; face symmetrical;   Assessment:  1. Neck pain     Plan:  Chronic issue- worsening pain and unable to see his neurosurgeon in follow up until July; reviewed MRI done in April 2025; patient is aware that our office does not do pain management; in the interim, will re-treat with Medrol  Dose Pak; referral to orthopedist in [facility] per patient request;   Discussed having patient follow up for CPE in 3 months;   No follow-ups on file.  Orders Placed This Encounter  Procedures   Ambulatory referral to Orthopedic Surgery    Referral Priority:   Routine    Referral Type:   Surgical    Referral Reason:   Specialty Services Required  Requested Specialty:   Orthopedic Surgery    Number of Visits Requested:   1    Requested Prescriptions   Signed Prescriptions Disp Refills   methylPREDNISolone  (MEDROL  DOSEPAK) 4 MG TBPK tablet 21 tablet 0    Sig: Taper as directed

## 2024-01-23 ENCOUNTER — Ambulatory Visit: Attending: Neurological Surgery

## 2024-01-23 DIAGNOSIS — M6281 Muscle weakness (generalized): Secondary | ICD-10-CM | POA: Diagnosis present

## 2024-01-23 DIAGNOSIS — R2689 Other abnormalities of gait and mobility: Secondary | ICD-10-CM | POA: Insufficient documentation

## 2024-01-23 DIAGNOSIS — M62838 Other muscle spasm: Secondary | ICD-10-CM | POA: Insufficient documentation

## 2024-01-23 DIAGNOSIS — R293 Abnormal posture: Secondary | ICD-10-CM | POA: Diagnosis present

## 2024-01-23 DIAGNOSIS — M25512 Pain in left shoulder: Secondary | ICD-10-CM | POA: Diagnosis present

## 2024-01-23 NOTE — Therapy (Signed)
 OUTPATIENT PHYSICAL THERAPY CERVICAL TREATMENT   Patient Name: William Howe MRN: [id number] DOB:1991-01-24, 33 y.o., male Today's Date: 01/23/2024  END OF SESSION:  PT End of Session - 01/23/24 1535     Visit Number 11    Number of Visits 18    Date for PT Re-Evaluation 02/14/24    Authorization Type [employer]    PT Start Time 1530    PT Stop Time 1613    PT Time Calculation (min) 43 min    Activity Tolerance Patient tolerated treatment well    Behavior During Therapy [facility] for tasks assessed/performed              History reviewed. No pertinent past medical history. Past Surgical History:  Procedure Laterality Date   HAND SURGERY     TOE SURGERY     There are no active problems to display for this patient.   PCP: Adra Alanis, FNP  REFERRING PROVIDER: Elna Haggis, MD  REFERRING DIAG: [phone] (ICD-10-CM) - Radiculopathy, cervical region  THERAPY DIAG:  Abnormal posture  Muscle weakness (generalized)  Acute pain of left shoulder  Other muscle spasm  Other abnormalities of gait and mobility  Rationale for Evaluation and Treatment: Rehabilitation  ONSET DATE: ~1-2 months ago  SUBJECTIVE:                                                                                                                                                                                                         SUBJECTIVE STATEMENT: Pt reports his pain has been really bad as of late, he was sent to his PCP for pain management but PCP referred him to different neurologist.  Hand dominance: Right  PERTINENT HISTORY:  Shoulder pain years ago (had a shot and exercises)  PAIN:  Are you having pain? Yes: NPRS scale: 2/10 Pain location: L anterior shoulder Pain description: Extreme tightness Aggravating factors: Work activities Relieving factors: Resting at home  PRECAUTIONS: None  RED FLAGS: None     WEIGHT BEARING RESTRICTIONS: No  FALLS:  Has patient fallen  in last 6 months? 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  DIAGNOSTIC FINDINGS:  MRI not yet scheduled  PATIENT SURVEYS:  Neck Disability  Index score: 14 / 50 = 28.0 %  COGNITION: Overall cognitive status: Within functional limits for tasks assessed  SENSATION: None  POSTURE: rounded shoulders, decreased thoracic kyphosis, and GH head anteriorly and superiorly oriented  PALPATION: TTP & trigger points noted in L UT, pec minor/major, suboccipitals   CERVICAL ROM:   Active ROM A/PROM (deg) eval  Flexion 31  Extension 35  Right lateral flexion 45  Left lateral flexion 35  Right rotation 80  Left rotation 70 "a little discomfort"   (Blank rows = not tested)  UPPER EXTREMITY ROM:  Active ROM Right eval Left eval L 12/12/23  Shoulder flexion 160 150 * 153- discomfort  Shoulder extension     Shoulder abduction 150 135 * 150- discomfort  Shoulder adduction     Shoulder extension 80 70* 73  Shoulder internal rotation T4 T5* T6  Shoulder external rotation C7 C7 T3  Elbow flexion     Elbow extension     Wrist flexion     Wrist extension     Wrist ulnar deviation     Wrist radial deviation     Wrist pronation     Wrist supination      (Blank rows = not tested, * = pain)  UPPER EXTREMITY MMT:  MMT Right eval Left eval Left 01/03/24  Shoulder flexion 5 4- 4+  Shoulder extension 5 5   Shoulder abduction 5 4+ 4+shakiness  Shoulder adduction     Shoulder extension     Shoulder internal rotation 5 5 4+  Shoulder external rotation 5 3+ * 4  Middle trapezius 4 3+   Lower trapezius 3 Unable to test due to  pain 4  Elbow flexion     Elbow extension     Wrist flexion     Wrist extension     Wrist ulnar deviation     Wrist radial deviation     Wrist pronation     Wrist supination     Grip strength      (Blank rows = not tested, * = pain)  CERVICAL SPECIAL TESTS:  Spurling's test: Negative  FUNCTIONAL TESTS:  Did not assess  TREATMENT DATE:  01/23/24 UBE L3; 3 min fwd, 3 min bwd STM to L anterior deltoid, bicep, pecs, UT GH joint inferior mobilizations grade I-II for pain  Thoracic extension over foam roll 5x3" Sidelying:  L shoulder flexion 2lb x 10  L shoulder horizontal ABD x 10 2lb  L shoulder ER 2lb x 10  01/09/24 UBE L4; 3 min fwd, 3 min bwd Standing shoulder flexion red TB 2x10 Standing shoulder abd red TB 2x10 Standing flexion and then abd overhead press red TB 2x10 Bodyblade L shoulder horizontal abd/add x30" Bodyblade L shoulder ER/IR with 0 deg abd x30" Bodyblade L shoulder flexion/ext x30" Bodyblade bilat UE overhead flexion 2x30" BATCA Low Row 25# 2x10 BATCA Mid Row 25# 2x10 BATCA lat pull down 20# 2x10 BATCA on lat pull down machine holding 35#, scapular depression 2x10  01/03/24 UBE L3; 3 min fwd, 3 min bwd Reassessment of goals Unilateral reaches 4lb x 10 R and L UE Lateral raises 3lb x 10  Deadlifts 15lb x 10  Unilateral farmer carry 15lb 2x90 ft each DTM to L UT   12/26/23 UBE L3; 3 min fwd, 3 min bwd STM to anterior deltoid, bicep attachment to shoulder: IASTM with edge tool to UT, rhomboids, LS L shoulder PROM all directions  L shoulder isometrics ER,IR, ext 5x5"  12/21/23  UBE L3; 3 min fwd, 3 min bwd Seated shoulder ER blue TB 2x10 "W" blue TB 2x10 Horizontal shoulder abd blue TB 2x10 Lower trap set on wall blue TB x 10 Standing cervical retraction + shoulder flex 3# x10 Standing cervical retraction + scaption 3# x 10 Cat/cow x 10 Open books 5x B Neck Disability Index score: 4 / 50 = 8.0 %  12/19/23 UBE L4; 3 min fwd, 3 min bwd Doorway  pec stretch low, mid, high x30" each UT stretch x30" R&L Seated scap depression hands on mat table 2x10 Seated shoulder ER green TB 2x10 "W" green TB 2x10 Horizontal shoulder abd green TB 2x10 Serratus activation at wall into low trap setting 2x10 Serratus activation with yellow TB shoulder flexion x10 Standing cervical retraction + shoulder flex 4# x10, 3# x10 Standing cervical retraction + scaption 4# 2x10  12/14/23 UBE L1; 3 min fwd, 3 min bwd Seated thoracic extension x10 Doorway pec stretch low, mid, high x30" each Trigger Point Dry Needling  Subsequent Treatment: Instructions provided previously at initial dry needling treatment.   Patient Verbal Consent Given: Yes Education Handout Provided: Previously Provided Muscles Treated: Bilat UT Electrical Stimulation Performed: No Treatment Response/Outcome: Decreased muscle tension  UT stretch x 30" R&L Scap depression 20# on lat pull down machine 2x10 Scap depression with lat pull down 20# 2x10 Low Row 15# 2x10 Mid row 15# 2x10 "W" green TB 2x10 Lat pull down green TB demo to perform at home Bottoms up [employer] 5# x2 laps Serratus anterior slide on wall 2x10  12/12/23 UBE L2.0 3 min each way UE ROM Box lifting to simulate work related activities 17lb with good body mechanics- cues needed to avoid rounding back x 20 Cabinet reaches 2lb bil shoulders x 10; 5lb x 10 bil Doorway high pec stretch 2x30" STM to L UT, upper rhomboids, LS Seated L UT stretch x 30"  12/07/23 UBE L1.0 3 min each way Assisted cervical extension with pillowcase x 10 Assisted cervical rotation with pillowcase x 10 bil Assisted thoracic ext with pillowcase x 10 Standing L shoulder flexion wall slide x 10 Standing L shoulder abduction wall slide x 10 Standing rows GTB 2x10 Standing shoulder extension GTB 2x10 Standing ER RTB x 10 R/L shoulder Low pec stretch in doorway 2x30" Supine chest stretch on 1/2 foam roll 2x30"    PATIENT EDUCATION:   Education details: HEP Person educated: Patient Education method: Programmer, multimedia, Facilities manager, and Handouts Education comprehension: verbalized understanding, returned demonstration, and needs further education  HOME EXERCISE PROGRAM: Access Code: [email or web address] URL: [email or web address] Date: 01/23/2024 Prepared by: Lakedra Washington  Exercises - Seated Upper Trapezius Stretch  - 1 x daily - 7 x weekly - 2 sets - 30 sec hold - Gentle Levator Scapulae Stretch  - 1 x daily - 7 x weekly - 2 sets - 30 sec hold - Corner Pec Minor Stretch  - 1 x daily - 7 x weekly - 2 sets - 30 sec hold - Doorway Pec Stretch at 60 Elevation  - 1 x daily - 7 x weekly - 2 sets - 30 sec hold - Standing Bilateral Low Shoulder Row with Anchored Resistance  - 1 x daily - 7 x weekly - 2-3 sets - 10 reps - Shoulder W - External Rotation with Resistance  - 1 x daily - 7 x weekly - 2-3 sets - 10 reps - Seated Lat Pull Down with Resistance - Elbows Bent  - 1 x daily - 7 x weekly -  2-3 sets - 10 reps - Standing Shoulder Flexion with Resistance  - 1 x daily - 7 x weekly - 2 sets - 10 reps - Standing Single Arm Shoulder Abduction with Resistance  - 1 x daily - 7 x weekly - 2 sets - 10 reps - Shoulder Overhead Press in Flexion with Dumbbells  - 1 x daily - 7 x weekly - 2 sets - 10 reps - Shoulder Overhead Press in Abduction with Dumbbells  - 1 x daily - 7 x weekly - 2 sets - 10 reps - Sidelying Shoulder ER with Towel and Dumbbell  - 1 x daily - 3 x weekly - 2 sets - 10 reps - Sidelying Shoulder Horizontal Abduction  - 1 x daily - 3 x weekly - 2 sets - 10 reps - Sidelying Shoulder Flexion 15 Degrees  - 1 x daily - 3 x weekly - 2 sets - 10 reps  ASSESSMENT:  CLINICAL IMPRESSION: Pt reports he may have overdid exercises the last time he was here. He has been in pain while working ever since. D/t increased pain we did some MT for pain. We reviewed sidelying shoulder horiz ABD, flexion, and ER for stability and added to  HEP. It does sound like he has some overuse of his L shoulder which may be causing his pain, he may need to see an Orthopedist or Sports Medicine MD.  OBJECTIVE IMPAIRMENTS: decreased activity tolerance, decreased coordination, decreased endurance, decreased mobility, decreased ROM, decreased strength, increased fascial restrictions, increased muscle spasms, impaired flexibility, impaired UE functional use, improper body mechanics, postural dysfunction, and pain.   ACTIVITY LIMITATIONS: carrying, lifting, sleeping, bed mobility, bathing, toileting, dressing, reach over head, and hygiene/grooming  PARTICIPATION LIMITATIONS: meal prep, cleaning, laundry, community activity, occupation, and yard work  PERSONAL FACTORS: Age, Fitness, Past/current experiences, and Time since onset of injury/illness/exacerbation are also affecting patient's functional outcome.   REHAB POTENTIAL: Good  CLINICAL DECISION MAKING: Evolving/moderate complexity  EVALUATION COMPLEXITY: Moderate   GOALS: Goals reviewed with patient? Yes  SHORT TERM GOALS: Target date: 12/12/2023   Pt will be ind with initial HEP Baseline:  Goal status: MET- 12/07/23  2.  Pt will demo full shoulder ROM without pain Baseline:  Goal status: MET- discomfort not pain 01/03/24  3.  Pt will be independent with maintaining shoulders down and back Baseline:  Goal status: MET- 01/03/24   LONG TERM GOALS: Target date: 01/02/2024 extended to 02/14/24   Pt will be ind with management and progression of HEP Baseline:  Goal status: INITIAL  2.  Pt will be able to lift and carry at least 20# without pain for work tasks Baseline:  Goal status: PARTIALLY MET- 01/03/24   3.  Pt will have improved L shoulder MMT to >/=4/5 to demo increasing postural stability Baseline:  Goal status: PARTIALLY MET 01/03/24  4.  Pt will have improved NDI to </=18% to demo MCID Baseline:  Goal status: MET- 12/21/23    PLAN:  PT FREQUENCY: 2x/week  PT  DURATION: 6 weeks  PLANNED INTERVENTIONS: 97164- PT Re-evaluation, 97110-Therapeutic exercises, 97530- Therapeutic activity, 97112- Neuromuscular re-education, 97535- Self Care, 29528- Manual therapy, [id number]- Electrical stimulation (unattended), 41324- Traction (mechanical), [id number]- Ionotophoresis 4mg /ml Dexamethasone , Patient/Family education, Taping, Dry Needling, Joint mobilization, Spinal mobilization, Cryotherapy, and Moist heat  PLAN FOR NEXT SESSION: Manual/dry needling as indicated for L UT. Work on lifting and endurance. Strengthen posterior shoulder girdle, stretch pecs/UTs, decrease UT compensation, work on shoulder ROM   Samuella Crocker, PTA  01/23/2024, 4:13 PM

## 2024-01-25 ENCOUNTER — Ambulatory Visit

## 2024-01-25 DIAGNOSIS — M25512 Pain in left shoulder: Secondary | ICD-10-CM

## 2024-01-25 DIAGNOSIS — R2689 Other abnormalities of gait and mobility: Secondary | ICD-10-CM

## 2024-01-25 DIAGNOSIS — R293 Abnormal posture: Secondary | ICD-10-CM | POA: Diagnosis not present

## 2024-01-25 DIAGNOSIS — M62838 Other muscle spasm: Secondary | ICD-10-CM

## 2024-01-25 DIAGNOSIS — M6281 Muscle weakness (generalized): Secondary | ICD-10-CM

## 2024-01-25 NOTE — Therapy (Signed)
 OUTPATIENT PHYSICAL THERAPY CERVICAL TREATMENT   Patient Name: William Howe MRN: [id number] DOB:18-Apr-1991, 33 y.o., male Today's Date: 01/25/2024  END OF SESSION:  PT End of Session - 01/25/24 1608     Visit Number 12    Number of Visits 18    Date for PT Re-Evaluation 02/14/24    Authorization Type [employer]    PT Start Time 1535    PT Stop Time 1615    PT Time Calculation (min) 40 min    Activity Tolerance Patient tolerated treatment well    Behavior During Therapy [facility] for tasks assessed/performed               History reviewed. No pertinent past medical history. Past Surgical History:  Procedure Laterality Date   HAND SURGERY     TOE SURGERY     There are no active problems to display for this patient.   PCP: Adra Alanis, FNP  REFERRING PROVIDER: Elna Haggis, MD  REFERRING DIAG: [phone] (ICD-10-CM) - Radiculopathy, cervical region  THERAPY DIAG:  Abnormal posture  Muscle weakness (generalized)  Acute pain of left shoulder  Other muscle spasm  Other abnormalities of gait and mobility  Rationale for Evaluation and Treatment: Rehabilitation  ONSET DATE: ~1-2 months ago  SUBJECTIVE:                                                                                                                                                                                                         SUBJECTIVE STATEMENT: "Worked yesterday, wasn't supposed to started to hurt but not bad today."  Hand dominance: Right  PERTINENT HISTORY:  Shoulder pain years ago (had a shot and exercises)  PAIN:  Are you having pain? Yes: NPRS scale: 0/10 Pain location: L anterior shoulder Pain description: Extreme tightness Aggravating factors: Work activities Relieving factors: Resting at home  PRECAUTIONS: None  RED FLAGS: None     WEIGHT BEARING RESTRICTIONS: No  FALLS:  Has patient fallen in last 6 months? 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  DIAGNOSTIC FINDINGS:  MRI not yet scheduled  PATIENT SURVEYS:  Neck Disability Index score: 14 / 50 = 28.0 %  COGNITION: Overall cognitive status: Within  functional limits for tasks assessed  SENSATION: None  POSTURE: rounded shoulders, decreased thoracic kyphosis, and GH head anteriorly and superiorly oriented  PALPATION: TTP & trigger points noted in L UT, pec minor/major, suboccipitals   CERVICAL ROM:   Active ROM A/PROM (deg) eval  Flexion 31  Extension 35  Right lateral flexion 45  Left lateral flexion 35  Right rotation 80  Left rotation 70 "a little discomfort"   (Blank rows = not tested)  UPPER EXTREMITY ROM:  Active ROM Right eval Left eval L 12/12/23  Shoulder flexion 160 150 * 153- discomfort  Shoulder extension     Shoulder abduction 150 135 * 150- discomfort  Shoulder adduction     Shoulder extension 80 70* 73  Shoulder internal rotation T4 T5* T6  Shoulder external rotation C7 C7 T3  Elbow flexion     Elbow extension     Wrist flexion     Wrist extension     Wrist ulnar deviation     Wrist radial deviation     Wrist pronation     Wrist supination      (Blank rows = not tested, * = pain)  UPPER EXTREMITY MMT:  MMT Right eval Left eval Left 01/03/24  Shoulder flexion 5 4- 4+  Shoulder extension 5 5   Shoulder abduction 5 4+ 4+shakiness  Shoulder adduction     Shoulder extension     Shoulder internal rotation 5 5 4+  Shoulder external rotation 5 3+ * 4  Middle trapezius 4 3+   Lower trapezius 3 Unable to test due to pain 4  Elbow flexion     Elbow extension     Wrist flexion      Wrist extension     Wrist ulnar deviation     Wrist radial deviation     Wrist pronation     Wrist supination     Grip strength      (Blank rows = not tested, * = pain)  CERVICAL SPECIAL TESTS:  Spurling's test: Negative  FUNCTIONAL TESTS:  Did not assess  TREATMENT DATE:  01/25/24 UBE L3; 3 min fwd, 3 min bwd Seated shoulder flexion and abduction with 1lb bar  Serratus slide with RTB x 10  Standing shoulder flexion and abduction 2x10 RTB Prone horizontal ABD 2lb 2x10 Prone lower trap raises x 10 2lb  Functional lifts and carry 27lb box 2x around the clinic Lifting 27lb box placing onto counter then back to floor 2 x 5  01/23/24 UBE L3; 3 min fwd, 3 min bwd STM to L anterior deltoid, bicep, pecs, UT GH joint inferior mobilizations grade I-II for pain  Thoracic extension over foam roll 5x3" Sidelying:  L shoulder flexion 2lb x 10  L shoulder horizontal ABD x 10 2lb  L shoulder ER 2lb x 10  01/09/24 UBE L4; 3 min fwd, 3 min bwd Standing shoulder flexion red TB 2x10 Standing shoulder abd red TB 2x10 Standing flexion and then abd overhead press red TB 2x10 Bodyblade L shoulder horizontal abd/add x30" Bodyblade L shoulder ER/IR with 0 deg abd x30" Bodyblade L shoulder flexion/ext x30" Bodyblade bilat UE overhead flexion 2x30" BATCA Low Row 25# 2x10 BATCA Mid Row 25# 2x10 BATCA lat pull down 20# 2x10 BATCA on lat pull down machine holding 35#, scapular depression 2x10  01/03/24 UBE L3; 3 min fwd, 3 min bwd Reassessment of goals Unilateral reaches 4lb x 10 R and L UE Lateral raises 3lb x 10  Deadlifts 15lb  x 10  Unilateral farmer carry 15lb 2x90 ft each DTM to L UT   12/26/23 UBE L3; 3 min fwd, 3 min bwd STM to anterior deltoid, bicep attachment to shoulder: IASTM with edge tool to UT, rhomboids, LS L shoulder PROM all directions  L shoulder isometrics ER,IR, ext 5x5"  12/21/23 UBE L3; 3 min fwd, 3 min bwd Seated shoulder ER blue TB 2x10 "W" blue TB  2x10 Horizontal shoulder abd blue TB 2x10 Lower trap set on wall blue TB x 10 Standing cervical retraction + shoulder flex 3# x10 Standing cervical retraction + scaption 3# x 10 Cat/cow x 10 Open books 5x B Neck Disability Index score: 4 / 50 = 8.0 %  12/19/23 UBE L4; 3 min fwd, 3 min bwd Doorway pec stretch low, mid, high x30" each UT stretch x30" R&L Seated scap depression hands on mat table 2x10 Seated shoulder ER green TB 2x10 "W" green TB 2x10 Horizontal shoulder abd green TB 2x10 Serratus activation at wall into low trap setting 2x10 Serratus activation with yellow TB shoulder flexion x10 Standing cervical retraction + shoulder flex 4# x10, 3# x10 Standing cervical retraction + scaption 4# 2x10  12/14/23 UBE L1; 3 min fwd, 3 min bwd Seated thoracic extension x10 Doorway pec stretch low, mid, high x30" each Trigger Point Dry Needling  Subsequent Treatment: Instructions provided previously at initial dry needling treatment.   Patient Verbal Consent Given: Yes Education Handout Provided: Previously Provided Muscles Treated: Bilat UT Electrical Stimulation Performed: No Treatment Response/Outcome: Decreased muscle tension  UT stretch x 30" R&L Scap depression 20# on lat pull down machine 2x10 Scap depression with lat pull down 20# 2x10 Low Row 15# 2x10 Mid row 15# 2x10 "W" green TB 2x10 Lat pull down green TB demo to perform at home Bottoms up [employer] 5# x2 laps Serratus anterior slide on wall 2x10  12/12/23 UBE L2.0 3 min each way UE ROM Box lifting to simulate work related activities 17lb with good body mechanics- cues needed to avoid rounding back x 20 Cabinet reaches 2lb bil shoulders x 10; 5lb x 10 bil Doorway high pec stretch 2x30" STM to L UT, upper rhomboids, LS Seated L UT stretch x 30"  12/07/23 UBE L1.0 3 min each way Assisted cervical extension with pillowcase x 10 Assisted cervical rotation with pillowcase x 10 bil Assisted thoracic ext with  pillowcase x 10 Standing L shoulder flexion wall slide x 10 Standing L shoulder abduction wall slide x 10 Standing rows GTB 2x10 Standing shoulder extension GTB 2x10 Standing ER RTB x 10 R/L shoulder Low pec stretch in doorway 2x30" Supine chest stretch on 1/2 foam roll 2x30"    PATIENT EDUCATION:  Education details: HEP Person educated: Patient Education method: Programmer, multimedia, Facilities manager, and Handouts Education comprehension: verbalized understanding, returned demonstration, and needs further education  HOME EXERCISE PROGRAM: Access Code: [email or web address] URL: [email or web address] Date: 01/23/2024 Prepared by: Sheronda Parran  Exercises - Seated Upper Trapezius Stretch  - 1 x daily - 7 x weekly - 2 sets - 30 sec hold - Gentle Levator Scapulae Stretch  - 1 x daily - 7 x weekly - 2 sets - 30 sec hold - Corner Pec Minor Stretch  - 1 x daily - 7 x weekly - 2 sets - 30 sec hold - Doorway Pec Stretch at 60 Elevation  - 1 x daily - 7 x weekly - 2 sets - 30 sec hold - Standing Bilateral Low Shoulder Row with Anchored Resistance  -  1 x daily - 7 x weekly - 2-3 sets - 10 reps - Shoulder W - External Rotation with Resistance  - 1 x daily - 7 x weekly - 2-3 sets - 10 reps - Seated Lat Pull Down with Resistance - Elbows Bent  - 1 x daily - 7 x weekly - 2-3 sets - 10 reps - Standing Shoulder Flexion with Resistance  - 1 x daily - 7 x weekly - 2 sets - 10 reps - Standing Single Arm Shoulder Abduction with Resistance  - 1 x daily - 7 x weekly - 2 sets - 10 reps - Shoulder Overhead Press in Flexion with Dumbbells  - 1 x daily - 7 x weekly - 2 sets - 10 reps - Shoulder Overhead Press in Abduction with Dumbbells  - 1 x daily - 7 x weekly - 2 sets - 10 reps - Sidelying Shoulder ER with Towel and Dumbbell  - 1 x daily - 3 x weekly - 2 sets - 10 reps - Sidelying Shoulder Horizontal Abduction  - 1 x daily - 3 x weekly - 2 sets - 10 reps - Sidelying Shoulder Flexion 15 Degrees  - 1 x daily - 3 x  weekly - 2 sets - 10 reps  ASSESSMENT:  CLINICAL IMPRESSION: Continued working on functional lifting and persicap strengthening to 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 Pt tolerated well, no increased pain but some tightness that began over his UT/supraspinatus, this calmed down after a while. Instructed patient on correct form and technique to minimize injury and for optimal movement.  OBJECTIVE IMPAIRMENTS: decreased activity tolerance, decreased coordination, decreased endurance, decreased mobility, decreased ROM, decreased strength, increased fascial restrictions, increased muscle spasms, impaired flexibility, impaired UE functional use, improper body mechanics, postural dysfunction, and pain.   ACTIVITY LIMITATIONS: carrying, lifting, sleeping, bed mobility, bathing, toileting, dressing, reach over head, and hygiene/grooming  PARTICIPATION LIMITATIONS: meal prep, cleaning, laundry, community activity, occupation, and yard work  PERSONAL FACTORS: Age, Fitness, Past/current experiences, and Time since onset of injury/illness/exacerbation are also affecting patient's functional outcome.   REHAB POTENTIAL: Good  CLINICAL DECISION MAKING: Evolving/moderate complexity  EVALUATION COMPLEXITY: Moderate   GOALS: Goals reviewed with patient? Yes  SHORT TERM GOALS: Target date: 12/12/2023   Pt will be ind with initial HEP Baseline:  Goal status: MET- 12/07/23  2.  Pt will demo full shoulder ROM without pain Baseline:  Goal status: MET- discomfort not pain 01/03/24  3.  Pt will be independent with maintaining shoulders down and back Baseline:  Goal status: MET- 01/03/24   LONG TERM GOALS: Target date: 01/02/2024 extended to 02/14/24   Pt will be ind with management and progression of HEP Baseline:  Goal status: INITIAL  2.  Pt will be able to lift and carry at least 20# without pain for work tasks Baseline:  Goal status: PARTIALLY MET- 01/03/24   3.  Pt will have  improved L shoulder MMT to >/=4/5 to demo increasing postural stability Baseline:  Goal status: PARTIALLY MET 01/03/24  4.  Pt will have improved NDI to </=18% to demo MCID Baseline:  Goal status: MET- 12/21/23    PLAN:  PT FREQUENCY: 2x/week  PT DURATION: 6 weeks  PLANNED INTERVENTIONS: 09811- PT Re-evaluation, 97110-Therapeutic exercises, 97530- Therapeutic activity, 97112- Neuromuscular re-education, 97535- Self Care, 91478- Manual therapy, G0283- Electrical stimulation (unattended), 29562- Traction (mechanical), [id number]- Ionotophoresis 4mg /ml Dexamethasone , Patient/Family education, Taping, Dry Needling, Joint mobilization, Spinal mobilization, Cryotherapy, and Moist heat  PLAN FOR NEXT SESSION: Manual/dry  needling as indicated for L UT. Work on lifting and endurance. Strengthen posterior shoulder girdle, stretch pecs/UTs, decrease UT compensation, work on shoulder ROM   Samuella Crocker, PTA 01/25/2024, 4:16 PM

## 2024-01-31 ENCOUNTER — Encounter

## 2024-02-02 ENCOUNTER — Ambulatory Visit

## 2024-02-02 DIAGNOSIS — R293 Abnormal posture: Secondary | ICD-10-CM | POA: Diagnosis not present

## 2024-02-02 DIAGNOSIS — R2689 Other abnormalities of gait and mobility: Secondary | ICD-10-CM

## 2024-02-02 DIAGNOSIS — M6281 Muscle weakness (generalized): Secondary | ICD-10-CM

## 2024-02-02 DIAGNOSIS — M25512 Pain in left shoulder: Secondary | ICD-10-CM

## 2024-02-02 DIAGNOSIS — M62838 Other muscle spasm: Secondary | ICD-10-CM

## 2024-02-02 NOTE — Therapy (Signed)
 OUTPATIENT PHYSICAL THERAPY CERVICAL TREATMENT   Patient Name: William Howe MRN: [id number] DOB:1990/08/29, 33 y.o., male Today's Date: 02/02/2024  END OF SESSION:  PT End of Session - 02/02/24 1539     Visit Number 13    Number of Visits 18    Date for PT Re-Evaluation 02/14/24    Authorization Type [employer]    PT Start Time 1531    PT Stop Time 1612    PT Time Calculation (min) 41 min    Activity Tolerance Patient tolerated treatment well    Behavior During Therapy [facility] for tasks assessed/performed             History reviewed. No pertinent past medical history. Past Surgical History:  Procedure Laterality Date   HAND SURGERY     TOE SURGERY     There are no active problems to display for this patient.   PCP: Adra Alanis, FNP  REFERRING PROVIDER: Elna Haggis, MD  REFERRING DIAG: [phone] (ICD-10-CM) - Radiculopathy, cervical region  THERAPY DIAG:  Abnormal posture  Muscle weakness (generalized)  Acute pain of left shoulder  Other muscle spasm  Other abnormalities of gait and mobility  Rationale for Evaluation and Treatment: Rehabilitation  ONSET DATE: ~1-2 months ago  SUBJECTIVE:                                                                                                                                                                                                         SUBJECTIVE STATEMENT: Pt reports he still has pain at work, it does take longer to start hurting. He goes to see Orthopedist on 6/25. Hand dominance: Right  PERTINENT HISTORY:  Shoulder pain years ago (had a shot and exercises)  PAIN:  Are you having pain? Yes: NPRS scale: 0/10 Pain location: L anterior shoulder Pain description: Extreme tightness Aggravating factors: Work activities Relieving factors: Resting at home  PRECAUTIONS: None  RED FLAGS: None     WEIGHT BEARING RESTRICTIONS: No  FALLS:  Has patient fallen in last 6 months? 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  DIAGNOSTIC FINDINGS:  MRI not yet scheduled  PATIENT SURVEYS:  Neck Disability Index score: 14 / 50 = 28.0 %  COGNITION: Overall cognitive status: Within functional limits for tasks assessed  SENSATION: None  POSTURE: rounded shoulders, decreased thoracic kyphosis, and GH head anteriorly and superiorly oriented  PALPATION: TTP & trigger points noted in L UT, pec minor/major, suboccipitals   CERVICAL ROM:   Active ROM A/PROM (deg) eval  Flexion 31  Extension 35  Right lateral flexion 45  Left lateral flexion 35  Right rotation 80  Left rotation 70 a little discomfort   (Blank rows = not tested)  UPPER EXTREMITY ROM:  Active ROM Right eval Left eval L 12/12/23  Shoulder flexion 160 150 * 153- discomfort  Shoulder extension     Shoulder abduction 150 135 * 150- discomfort  Shoulder adduction     Shoulder extension 80 70* 73  Shoulder internal rotation T4 T5* T6  Shoulder external rotation C7 C7 T3  Elbow flexion     Elbow extension     Wrist flexion     Wrist extension     Wrist ulnar deviation     Wrist radial deviation     Wrist pronation     Wrist supination      (Blank rows = not tested, * = pain)  UPPER EXTREMITY MMT:  MMT Right eval Left eval Left 01/03/24  Shoulder flexion 5 4- 4+  Shoulder extension 5 5   Shoulder abduction 5 4+ 4+shakiness  Shoulder adduction     Shoulder extension     Shoulder internal rotation 5 5 4+  Shoulder external rotation 5 3+ * 4  Middle trapezius 4 3+   Lower trapezius 3 Unable to test due to pain 4  Elbow flexion      Elbow extension     Wrist flexion     Wrist extension     Wrist ulnar deviation     Wrist radial deviation     Wrist pronation     Wrist supination     Grip strength      (Blank rows = not tested, * = pain)  CERVICAL SPECIAL TESTS:  Spurling's test: Negative  FUNCTIONAL TESTS:  Did not assess  TREATMENT DATE:  02/02/24 UBE L5; 3 min fwd, 3 min bwd Pec stretch hands behind back  Posterior capsule stretch   Body blade:  Flexion x 45 seconds  Abduction x 45 seconds Upside down BOSU:  Plank + lateral shoulder weight shift x 10  Standing:  ER RTB + shoulder flexion x 10 Seated rows 35lb 2x10    OHP with 15lb kettlebell 2x10  Farmer carry w/ shoulder 90 deg and 90 deg elbow flexion 10lb Cup stacking with 2lb weight on L wrist  01/25/24 UBE L3; 3 min fwd, 3 min bwd Seated shoulder flexion and abduction with 1lb bar  Serratus slide with RTB x 10  Standing shoulder flexion and abduction 2x10 RTB Prone horizontal ABD 2lb 2x10 Prone lower trap raises x 10 2lb  Functional lifts and carry 27lb box 2x around the clinic Lifting 27lb box placing onto counter then back to floor 2 x 5  01/23/24 UBE L3; 3 min fwd, 3 min bwd STM to L anterior deltoid, bicep, pecs, UT GH joint inferior mobilizations grade I-II for pain  Thoracic extension over foam roll 5x3 Sidelying:  L shoulder flexion 2lb x 10  L shoulder horizontal ABD x 10 2lb  L shoulder ER 2lb x 10  01/09/24 UBE L4; 3 min fwd, 3 min bwd Standing shoulder flexion red TB 2x10 Standing shoulder abd red TB 2x10  Standing flexion and then abd overhead press red TB 2x10 Bodyblade L shoulder horizontal abd/add x30 Bodyblade L shoulder ER/IR with 0 deg abd x30 Bodyblade L shoulder flexion/ext x30 Bodyblade bilat UE overhead flexion 2x30 BATCA Low Row 25# 2x10 BATCA Mid Row 25# 2x10 BATCA lat pull down 20# 2x10 BATCA on lat pull down machine holding 35#, scapular depression 2x10  01/03/24 UBE L3; 3 min fwd, 3 min  bwd Reassessment of goals Unilateral reaches 4lb x 10 R and L UE Lateral raises 3lb x 10  Deadlifts 15lb x 10  Unilateral farmer carry 15lb 2x90 ft each DTM to L UT   12/26/23 UBE L3; 3 min fwd, 3 min bwd STM to anterior deltoid, bicep attachment to shoulder: IASTM with edge tool to UT, rhomboids, LS L shoulder PROM all directions  L shoulder isometrics ER,IR, ext 5x5  12/21/23 UBE L3; 3 min fwd, 3 min bwd Seated shoulder ER blue TB 2x10 W blue TB 2x10 Horizontal shoulder abd blue TB 2x10 Lower trap set on wall blue TB x 10 Standing cervical retraction + shoulder flex 3# x10 Standing cervical retraction + scaption 3# x 10 Cat/cow x 10 Open books 5x B Neck Disability Index score: 4 / 50 = 8.0 %  12/19/23 UBE L4; 3 min fwd, 3 min bwd Doorway pec stretch low, mid, high x30 each UT stretch x30 R&L Seated scap depression hands on mat table 2x10 Seated shoulder ER green TB 2x10 W green TB 2x10 Horizontal shoulder abd green TB 2x10 Serratus activation at wall into low trap setting 2x10 Serratus activation with yellow TB shoulder flexion x10 Standing cervical retraction + shoulder flex 4# x10, 3# x10 Standing cervical retraction + scaption 4# 2x10  12/14/23 UBE L1; 3 min fwd, 3 min bwd Seated thoracic extension x10 Doorway pec stretch low, mid, high x30 each Trigger Point Dry Needling  Subsequent Treatment: Instructions provided previously at initial dry needling treatment.   Patient Verbal Consent Given: Yes Education Handout Provided: Previously Provided Muscles Treated: Bilat UT Electrical Stimulation Performed: No Treatment Response/Outcome: Decreased muscle tension  UT stretch x 30 R&L Scap depression 20# on lat pull down machine 2x10 Scap depression with lat pull down 20# 2x10 Low Row 15# 2x10 Mid row 15# 2x10 W green TB 2x10 Lat pull down green TB demo to perform at home Bottoms up [employer] 5# x2 laps Serratus anterior slide on wall  2x10  12/12/23 UBE L2.0 3 min each way UE ROM Box lifting to simulate work related activities 17lb with good body mechanics- cues needed to avoid rounding back x 20 Cabinet reaches 2lb bil shoulders x 10; 5lb x 10 bil Doorway high pec stretch 2x30 STM to L UT, upper rhomboids, LS Seated L UT stretch x 30  12/07/23 UBE L1.0 3 min each way Assisted cervical extension with pillowcase x 10 Assisted cervical rotation with pillowcase x 10 bil Assisted thoracic ext with pillowcase x 10 Standing L shoulder flexion wall slide x 10 Standing L shoulder abduction wall slide x 10 Standing rows GTB 2x10 Standing shoulder extension GTB 2x10 Standing ER RTB x 10 R/L shoulder Low pec stretch in doorway 2x30 Supine chest stretch on 1/2 foam roll 2x30    PATIENT EDUCATION:  Education details: HEP Person educated: Patient Education method: Programmer, multimedia, Facilities manager, and Handouts Education comprehension: verbalized understanding, returned demonstration, and needs further education  HOME EXERCISE PROGRAM: Access Code: [email or web address] URL: [email or web address] Date: 01/23/2024 Prepared by: Taven Strite  Exercises - Seated Upper Trapezius  Stretch  - 1 x daily - 7 x weekly - 2 sets - 30 sec hold - Gentle Levator Scapulae Stretch  - 1 x daily - 7 x weekly - 2 sets - 30 sec hold - Corner Pec Minor Stretch  - 1 x daily - 7 x weekly - 2 sets - 30 sec hold - Doorway Pec Stretch at 60 Elevation  - 1 x daily - 7 x weekly - 2 sets - 30 sec hold - Standing Bilateral Low Shoulder Row with Anchored Resistance  - 1 x daily - 7 x weekly - 2-3 sets - 10 reps - Shoulder W - External Rotation with Resistance  - 1 x daily - 7 x weekly - 2-3 sets - 10 reps - Seated Lat Pull Down with Resistance - Elbows Bent  - 1 x daily - 7 x weekly - 2-3 sets - 10 reps - Standing Shoulder Flexion with Resistance  - 1 x daily - 7 x weekly - 2 sets - 10 reps - Standing Single Arm Shoulder Abduction with Resistance  -  1 x daily - 7 x weekly - 2 sets - 10 reps - Shoulder Overhead Press in Flexion with Dumbbells  - 1 x daily - 7 x weekly - 2 sets - 10 reps - Shoulder Overhead Press in Abduction with Dumbbells  - 1 x daily - 7 x weekly - 2 sets - 10 reps - Sidelying Shoulder ER with Towel and Dumbbell  - 1 x daily - 3 x weekly - 2 sets - 10 reps - Sidelying Shoulder Horizontal Abduction  - 1 x daily - 3 x weekly - 2 sets - 10 reps - Sidelying Shoulder Flexion 15 Degrees  - 1 x daily - 3 x weekly - 2 sets - 10 reps  ASSESSMENT:  CLINICAL IMPRESSION: Advanced scapular and GH joint stabilization along with guiding patient through functional activities to improve work performance. Patient was challenged with all of the stabilization exercises and most of the functional activities. He still reports pain at work, however not as quick as before. Will continue strengthening to improve function.  OBJECTIVE IMPAIRMENTS: decreased activity tolerance, decreased coordination, decreased endurance, decreased mobility, decreased ROM, decreased strength, increased fascial restrictions, increased muscle spasms, impaired flexibility, impaired UE functional use, improper body mechanics, postural dysfunction, and pain.   ACTIVITY LIMITATIONS: carrying, lifting, sleeping, bed mobility, bathing, toileting, dressing, reach over head, and hygiene/grooming  PARTICIPATION LIMITATIONS: meal prep, cleaning, laundry, community activity, occupation, and yard work  PERSONAL FACTORS: Age, Fitness, Past/current experiences, and Time since onset of injury/illness/exacerbation are also affecting patient's functional outcome.   REHAB POTENTIAL: Good  CLINICAL DECISION MAKING: Evolving/moderate complexity  EVALUATION COMPLEXITY: Moderate   GOALS: Goals reviewed with patient? Yes  SHORT TERM GOALS: Target date: 12/12/2023   Pt will be ind with initial HEP Baseline:  Goal status: MET- 12/07/23  2.  Pt will demo full shoulder ROM without  pain Baseline:  Goal status: MET- discomfort not pain 01/03/24  3.  Pt will be independent with maintaining shoulders down and back Baseline:  Goal status: MET- 01/03/24   LONG TERM GOALS: Target date: 01/02/2024 extended to 02/14/24   Pt will be ind with management and progression of HEP Baseline:  Goal status: INITIAL  2.  Pt will be able to lift and carry at least 20# without pain for work tasks Baseline:  Goal status: PARTIALLY MET- 01/03/24   3.  Pt will have improved L shoulder MMT to >/=4/5  to demo increasing postural stability Baseline:  Goal status: PARTIALLY MET 01/03/24  4.  Pt will have improved NDI to </=18% to demo MCID Baseline:  Goal status: MET- 12/21/23    PLAN:  PT FREQUENCY: 2x/week  PT DURATION: 6 weeks  PLANNED INTERVENTIONS: 16109- PT Re-evaluation, 97110-Therapeutic exercises, 97530- Therapeutic activity, 97112- Neuromuscular re-education, 97535- Self Care, 60454- Manual therapy, G0283- Electrical stimulation (unattended), 09811- Traction (mechanical), 97033- Ionotophoresis 4mg /ml Dexamethasone , Patient/Family education, Taping, Dry Needling, Joint mobilization, Spinal mobilization, Cryotherapy, and Moist heat  PLAN FOR NEXT SESSION:  Work on lifting and endurance. Strengthen posterior shoulder girdle, stretch pecs/UTs, decrease UT compensation   Samuella Crocker, PTA 02/02/2024, 4:18 PM

## 2024-02-06 ENCOUNTER — Ambulatory Visit

## 2024-02-06 DIAGNOSIS — M6281 Muscle weakness (generalized): Secondary | ICD-10-CM

## 2024-02-06 DIAGNOSIS — R293 Abnormal posture: Secondary | ICD-10-CM

## 2024-02-06 DIAGNOSIS — M25512 Pain in left shoulder: Secondary | ICD-10-CM

## 2024-02-06 DIAGNOSIS — R2689 Other abnormalities of gait and mobility: Secondary | ICD-10-CM

## 2024-02-06 DIAGNOSIS — M62838 Other muscle spasm: Secondary | ICD-10-CM

## 2024-02-06 NOTE — Therapy (Signed)
 OUTPATIENT PHYSICAL THERAPY CERVICAL TREATMENT   Patient Name: William Howe MRN: [id number] DOB:[birth year], 33 y.o., male Today's Date: 02/06/2024  END OF SESSION:  PT End of Session - 02/06/24 1617     Visit Number 14    Number of Visits 18    Date for PT Re-Evaluation 02/14/24    Authorization Type [employer] Medicaid    PT Start Time 1535    PT Stop Time 1617    PT Time Calculation (min) 42 min    Activity Tolerance Patient tolerated treatment well    Behavior During Therapy WFL for tasks assessed/performed              History reviewed. No pertinent past medical history. Past Surgical History:  Procedure Laterality Date   HAND SURGERY     TOE SURGERY     There are no active problems to display for this patient.   PCP: Adra Alanis, FNP  REFERRING PROVIDER: Elna Haggis, MD  REFERRING DIAG: [id number] (ICD-10-CM) - Radiculopathy, cervical region  THERAPY DIAG:  Abnormal posture  Muscle weakness (generalized)  Acute pain of left shoulder  Other muscle spasm  Other abnormalities of gait and mobility  Rationale for Evaluation and Treatment: Rehabilitation  ONSET DATE: ~1-2 months ago  SUBJECTIVE:                                                                                                                                                                                                         SUBJECTIVE STATEMENT:  Hand dominance: Right  PERTINENT HISTORY:  Shoulder pain years ago (had a shot and exercises)  PAIN:  Are you having pain? Yes: NPRS scale: 0/10 Pain location: L anterior shoulder Pain description: Extreme tightness Aggravating factors: Work activities Relieving factors: Resting at home  PRECAUTIONS: None  RED FLAGS: None     WEIGHT BEARING RESTRICTIONS: No  FALLS:  Has patient fallen in last 6 months? 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  DIAGNOSTIC FINDINGS:  MRI not yet scheduled  PATIENT SURVEYS:  Neck Disability Index score: 14 / 50 = 28.0 %  COGNITION: Overall cognitive status: Within functional limits for tasks assessed  SENSATION: None  POSTURE: rounded shoulders, decreased  thoracic kyphosis, and GH head anteriorly and superiorly oriented  PALPATION: TTP & trigger points noted in L UT, pec minor/major, suboccipitals   CERVICAL ROM:   Active ROM A/PROM (deg) eval  Flexion 31  Extension 35  Right lateral flexion 45  Left lateral flexion 35  Right rotation 80  Left rotation 70 a little discomfort   (Blank rows = not tested)  UPPER EXTREMITY ROM:  Active ROM Right eval Left eval L 12/12/23  Shoulder flexion 160 150 * 153- discomfort  Shoulder extension     Shoulder abduction 150 135 * 150- discomfort  Shoulder adduction     Shoulder extension 80 70* 73  Shoulder internal rotation T4 T5* T6  Shoulder external rotation C7 C7 T3  Elbow flexion     Elbow extension     Wrist flexion     Wrist extension     Wrist ulnar deviation     Wrist radial deviation     Wrist pronation     Wrist supination      (Blank rows = not tested, * = pain)  UPPER EXTREMITY MMT:  MMT Right eval Left eval Left 01/03/24  Shoulder flexion 5 4- 4+  Shoulder extension 5 5   Shoulder abduction 5 4+ 4+shakiness  Shoulder adduction     Shoulder extension     Shoulder internal rotation 5 5 4+  Shoulder external rotation 5 3+ * 4  Middle trapezius 4 3+   Lower trapezius 3 Unable to test due to pain 4  Elbow flexion     Elbow extension     Wrist flexion     Wrist extension     Wrist ulnar deviation      Wrist radial deviation     Wrist pronation     Wrist supination     Grip strength      (Blank rows = not tested, * = pain)  CERVICAL SPECIAL TESTS:  Spurling's test: Negative  FUNCTIONAL TESTS:  Did not assess  TREATMENT DATE:  02/06/24 UBE L5; 3 min fwd, 3 min bwd Upside down BOSU:  Plank + lateral shoulder weight shift 2x  ER RTB + shoulder flexion x 10  ER in 90 deg of abduction 2x10 Seated rows 35lb 2x15  Standing shoulder flexion 5lb 2x10 B  OHP with 10lb DB 2x10  Farmer carry w/ shoulder 90 deg and 90 deg elbow flexion 10lb 2x90 ft KT tape to L shoulder 1 strip from ant shld to mid trap, 2nd strip from ant shld to UT cross fibers  02/02/24 UBE L5; 3 min fwd, 3 min bwd Pec stretch hands behind back  Posterior capsule stretch   Body blade:  Flexion x 45 seconds  Abduction x 45 seconds Upside down BOSU:  Plank + lateral shoulder weight shift x 10  Standing:  ER RTB + shoulder flexion x 10 Seated rows 35lb 2x10    OHP with 15lb kettlebell 2x10  Farmer carry w/ shoulder 90 deg and 90 deg elbow flexion 10lb Cup stacking with 2lb weight on L wrist  01/25/24 UBE L3; 3 min fwd, 3 min bwd Seated shoulder flexion and abduction with 1lb bar  Serratus slide with RTB x 10  Standing shoulder flexion and abduction 2x10 RTB Prone horizontal ABD 2lb 2x10 Prone lower trap raises x 10 2lb  Functional lifts and carry 27lb box 2x around the clinic Lifting 27lb box placing onto counter then back to floor 2 x 5  01/23/24 UBE L3; 3  min fwd, 3 min bwd STM to L anterior deltoid, bicep, pecs, UT GH joint inferior mobilizations grade I-II for pain  Thoracic extension over foam roll 5x3 Sidelying:  L shoulder flexion 2lb x 10  L shoulder horizontal ABD x 10 2lb  L shoulder ER 2lb x 10     PATIENT EDUCATION:  Education details: HEP Person educated: Patient Education method: Programmer, multimedia, Facilities manager, and Handouts Education comprehension: verbalized understanding, returned  demonstration, and needs further education  HOME EXERCISE PROGRAM: Access Code: [email or web address] URL: [email or web address] Date: 01/23/2024 Prepared by: Dovie Gell  Exercises - Seated Upper Trapezius Stretch  - 1 x daily - 7 x weekly - 2 sets - 30 sec hold - Gentle Levator Scapulae Stretch  - 1 x daily - 7 x weekly - 2 sets - 30 sec hold - Corner Pec Minor Stretch  - 1 x daily - 7 x weekly - 2 sets - 30 sec hold - Doorway Pec Stretch at 60 Elevation  - 1 x daily - 7 x weekly - 2 sets - 30 sec hold - Standing Bilateral Low Shoulder Row with Anchored Resistance  - 1 x daily - 7 x weekly - 2-3 sets - 10 reps - Shoulder W - External Rotation with Resistance  - 1 x daily - 7 x weekly - 2-3 sets - 10 reps - Seated Lat Pull Down with Resistance - Elbows Bent  - 1 x daily - 7 x weekly - 2-3 sets - 10 reps - Standing Shoulder Flexion with Resistance  - 1 x daily - 7 x weekly - 2 sets - 10 reps - Standing Single Arm Shoulder Abduction with Resistance  - 1 x daily - 7 x weekly - 2 sets - 10 reps - Shoulder Overhead Press in Flexion with Dumbbells  - 1 x daily - 7 x weekly - 2 sets - 10 reps - Shoulder Overhead Press in Abduction with Dumbbells  - 1 x daily - 7 x weekly - 2 sets - 10 reps - Sidelying Shoulder ER with Towel and Dumbbell  - 1 x daily - 3 x weekly - 2 sets - 10 reps - Sidelying Shoulder Horizontal Abduction  - 1 x daily - 3 x weekly - 2 sets - 10 reps - Sidelying Shoulder Flexion 15 Degrees  - 1 x daily - 3 x weekly - 2 sets - 10 reps  ASSESSMENT:  CLINICAL IMPRESSION: Continued advancing scapular and GH joint stabilization along with guiding patient through functional activities to improve work performance. He is noting more pinching along anterior shoulder, which most likely is from increased load on anterior shoulder, we tried KT tape for proper alignment of the GH joint.   OBJECTIVE IMPAIRMENTS: decreased activity tolerance, decreased coordination, decreased endurance,  decreased mobility, decreased ROM, decreased strength, increased fascial restrictions, increased muscle spasms, impaired flexibility, impaired UE functional use, improper body mechanics, postural dysfunction, and pain.   ACTIVITY LIMITATIONS: carrying, lifting, sleeping, bed mobility, bathing, toileting, dressing, reach over head, and hygiene/grooming  PARTICIPATION LIMITATIONS: meal prep, cleaning, laundry, community activity, occupation, and yard work  PERSONAL FACTORS: Age, Fitness, Past/current experiences, and Time since onset of injury/illness/exacerbation are also affecting patient's functional outcome.   REHAB POTENTIAL: Good  CLINICAL DECISION MAKING: Evolving/moderate complexity  EVALUATION COMPLEXITY: Moderate   GOALS: Goals reviewed with patient? Yes  SHORT TERM GOALS: Target date: 12/12/2023   Pt will be ind with initial HEP Baseline:  Goal status: MET- 12/07/23  2.  Pt will demo full  shoulder ROM without pain Baseline:  Goal status: MET- discomfort not pain 01/03/24  3.  Pt will be independent with maintaining shoulders down and back Baseline:  Goal status: MET- 01/03/24   LONG TERM GOALS: Target date: 01/02/2024 extended to 02/14/24   Pt will be ind with management and progression of HEP Baseline:  Goal status: INITIAL  2.  Pt will be able to lift and carry at least 20# without pain for work tasks Baseline:  Goal status: PARTIALLY MET- 01/03/24   3.  Pt will have improved L shoulder MMT to >/=4/5 to demo increasing postural stability Baseline:  Goal status: PARTIALLY MET 01/03/24  4.  Pt will have improved NDI to </=18% to demo MCID Baseline:  Goal status: MET- 12/21/23    PLAN:  PT FREQUENCY: 2x/week  PT DURATION: 6 weeks  PLANNED INTERVENTIONS: 60454- PT Re-evaluation, 97110-Therapeutic exercises, 97530- Therapeutic activity, 97112- Neuromuscular re-education, 97535- Self Care, 09811- Manual therapy, G0283- Electrical stimulation (unattended),  91478- Traction (mechanical), [id number]- Ionotophoresis 4mg /ml Dexamethasone , Patient/Family education, Taping, Dry Needling, Joint mobilization, Spinal mobilization, Cryotherapy, and Moist heat  PLAN FOR NEXT SESSION:  Work on lifting and endurance. Strengthen posterior shoulder girdle, stretch pecs/UTs, decrease UT compensation   Samuella Crocker, PTA 02/06/2024, 4:17 PM

## 2024-02-08 ENCOUNTER — Ambulatory Visit: Admitting: Physical Therapy

## 2024-02-08 DIAGNOSIS — R2689 Other abnormalities of gait and mobility: Secondary | ICD-10-CM

## 2024-02-08 DIAGNOSIS — M62838 Other muscle spasm: Secondary | ICD-10-CM

## 2024-02-08 DIAGNOSIS — R293 Abnormal posture: Secondary | ICD-10-CM

## 2024-02-08 DIAGNOSIS — M25512 Pain in left shoulder: Secondary | ICD-10-CM

## 2024-02-08 DIAGNOSIS — M6281 Muscle weakness (generalized): Secondary | ICD-10-CM

## 2024-02-08 NOTE — Therapy (Signed)
 OUTPATIENT PHYSICAL THERAPY CERVICAL TREATMENT   Patient Name: William Howe MRN: [id number] DOB:06/19/1991, 33 y.o., male Today's Date: 02/08/2024  END OF SESSION:  PT End of Session - 02/08/24 1533     Visit Number 15    Number of Visits 18    Date for PT Re-Evaluation 02/14/24    Authorization Type [employer]    PT Start Time 1533    PT Stop Time 1615    PT Time Calculation (min) 42 min    Activity Tolerance Patient tolerated treatment well    Behavior During Therapy [facility] for tasks assessed/performed          No past medical history on file. Past Surgical History:  Procedure Laterality Date   HAND SURGERY     TOE SURGERY     There are no active problems to display for this patient.   PCP: Adra Alanis, FNP  REFERRING PROVIDER: Elna Haggis, MD  REFERRING DIAG: [phone] (ICD-10-CM) - Radiculopathy, cervical region  THERAPY DIAG:  Abnormal posture  Muscle weakness (generalized)  Acute pain of left shoulder  Other muscle spasm  Other abnormalities of gait and mobility  Rationale for Evaluation and Treatment: Rehabilitation  ONSET DATE: ~1-2 months ago  SUBJECTIVE:                                                                                                                                                                                                         SUBJECTIVE STATEMENT: Shoulders feel about the same. Pt states the pain on the top of the shoulder and the neck has improved. Has been having increased pain more along the front of the shoulder. Feels pinching in the anterior shoulder for the last couple of weeks. Will be seeing ortho next Wednesday.  Hand dominance: Right  PERTINENT HISTORY:  Shoulder pain years ago (had a shot and exercises)  PAIN:  Are you having pain? Yes: NPRS scale: 0 currently/10 Pain location: L anterior shoulder Pain description: Extreme tightness Aggravating factors: Work activities Relieving factors:  Resting at home  PRECAUTIONS: None  RED FLAGS: None     WEIGHT BEARING RESTRICTIONS: No  FALLS:  Has patient fallen in last 6 months? 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  DIAGNOSTIC FINDINGS:  11/23/23 Cervical MRI IMPRESSION: Minimal disc bulging C5-6 and C6-7. The exam is otherwise negative. The central canal and foramina are widely patent at all levels.  PATIENT SURVEYS:  Neck Disability Index score: 14 / 50 = 28.0 %  COGNITION: Overall cognitive status: Within functional limits for tasks assessed  SENSATION: None  POSTURE: rounded shoulders, decreased thoracic kyphosis, and GH head anteriorly and superiorly oriented  PALPATION: TTP & trigger points noted in L UT, pec minor/major, suboccipitals   CERVICAL ROM:   Active ROM A/PROM (deg) eval  Flexion 31  Extension 35  Right lateral flexion 45  Left lateral flexion 35  Right rotation 80  Left rotation 70 a little discomfort   (Blank rows = not tested)  UPPER EXTREMITY ROM:  Active ROM Right eval Left eval L 12/12/23  Shoulder flexion 160 150 * 153- discomfort  Shoulder extension     Shoulder abduction 150 135 * 150- discomfort  Shoulder adduction     Shoulder extension 80 70* 73  Shoulder internal rotation T4 T5* T6  Shoulder external rotation C7 C7 T3  Elbow flexion     Elbow extension     Wrist flexion     Wrist extension     Wrist ulnar deviation     Wrist radial deviation     Wrist pronation     Wrist supination      (Blank rows = not tested, * = pain)  UPPER EXTREMITY MMT:  MMT Right eval Left eval Left 01/03/24   Shoulder flexion 5 4- 4+  Shoulder extension 5 5   Shoulder abduction 5 4+ 4+shakiness  Shoulder adduction     Shoulder extension     Shoulder internal rotation 5 5 4+  Shoulder external rotation 5 3+ * 4  Middle trapezius 4 3+   Lower trapezius 3 Unable to test due to pain 4  Elbow flexion     Elbow extension     Wrist flexion     Wrist extension     Wrist ulnar deviation     Wrist radial deviation     Wrist pronation     Wrist supination     Grip strength      (Blank rows = not tested, * = pain)  CERVICAL SPECIAL TESTS:  Spurling's test: Negative  FUNCTIONAL TESTS:  Did not assess  TREATMENT DATE:  02/08/24 UBE L3; 3 min fwd, 3 min bwd Doorway pec stretch low, mid, high 2x30  Standing  Facing wall, horizontal shoulder abd off wall 2x10  Against wall, posterior scapular tilt 2x10  Against wall snow angels 2x10  Shoulder ER green TB 2x10  W green TB 2x10 Sitting  Shoulder ER with flexion red TB 2x10  Shoulder flexion into horizontal abd overhead red TB 2x10 Self care  Demonstrated to pt and mother how to apply k tape to pull shoulder down and back to decrease anterior load    02/06/24 UBE L5; 3 min fwd, 3 min bwd Upside down BOSU:  Plank + lateral shoulder weight shift 2x  ER RTB + shoulder flexion x 10  ER in 90 deg of abduction 2x10 Seated rows 35lb 2x15  Standing shoulder flexion 5lb 2x10 B  OHP with 10lb DB 2x10  Farmer carry w/ shoulder 90 deg and 90 deg elbow flexion 10lb 2x90 ft KT tape to L shoulder 1 strip from ant shld to mid trap, 2nd strip from ant shld to UT  cross fibers  02/02/24 UBE L5; 3 min fwd, 3 min bwd Pec stretch hands behind back  Posterior capsule stretch   Body blade:  Flexion x 45 seconds  Abduction x 45 seconds Upside down BOSU:  Plank + lateral shoulder weight shift x 10  Standing:  ER RTB + shoulder flexion x 10 Seated rows 35lb 2x10    OHP with 15lb kettlebell 2x10  Farmer carry w/ shoulder 90 deg and 90 deg elbow  flexion 10lb Cup stacking with 2lb weight on L wrist   01/25/24 UBE L3; 3 min fwd, 3 min bwd Seated shoulder flexion and abduction with 1lb bar  Serratus slide with RTB x 10  Standing shoulder flexion and abduction 2x10 RTB Prone horizontal ABD 2lb 2x10 Prone lower trap raises x 10 2lb  Functional lifts and carry 27lb box 2x around the clinic Lifting 27lb box placing onto counter then back to floor 2 x 5  01/23/24 UBE L3; 3 min fwd, 3 min bwd STM to L anterior deltoid, bicep, pecs, UT GH joint inferior mobilizations grade I-II for pain  Thoracic extension over foam roll 5x3 Sidelying:  L shoulder flexion 2lb x 10  L shoulder horizontal ABD x 10 2lb  L shoulder ER 2lb x 10     PATIENT EDUCATION:  Education details: HEP Person educated: Patient Education method: Programmer, multimedia, Facilities manager, and Handouts Education comprehension: verbalized understanding, returned demonstration, and needs further education  HOME EXERCISE PROGRAM: Access Code: [email or web address] URL: [email or web address] Date: 01/23/2024 Prepared by: Braylin Clark  Exercises - Seated Upper Trapezius Stretch  - 1 x daily - 7 x weekly - 2 sets - 30 sec hold - Gentle Levator Scapulae Stretch  - 1 x daily - 7 x weekly - 2 sets - 30 sec hold - Corner Pec Minor Stretch  - 1 x daily - 7 x weekly - 2 sets - 30 sec hold - Doorway Pec Stretch at 60 Elevation  - 1 x daily - 7 x weekly - 2 sets - 30 sec hold - Standing Bilateral Low Shoulder Row with Anchored Resistance  - 1 x daily - 7 x weekly - 2-3 sets - 10 reps - Shoulder W - External Rotation with Resistance  - 1 x daily - 7 x weekly - 2-3 sets - 10 reps - Seated Lat Pull Down with Resistance - Elbows Bent  - 1 x daily - 7 x weekly - 2-3 sets - 10 reps - Standing Shoulder Flexion with Resistance  - 1 x daily - 7 x weekly - 2 sets - 10 reps - Standing Single Arm Shoulder Abduction with Resistance  - 1 x daily - 7 x weekly - 2 sets - 10 reps - Shoulder Overhead  Press in Flexion with Dumbbells  - 1 x daily - 7 x weekly - 2 sets - 10 reps - Shoulder Overhead Press in Abduction with Dumbbells  - 1 x daily - 7 x weekly - 2 sets - 10 reps - Sidelying Shoulder ER with Towel and Dumbbell  - 1 x daily - 3 x weekly - 2 sets - 10 reps - Sidelying Shoulder Horizontal Abduction  - 1 x daily - 3 x weekly - 2 sets - 10 reps - Sidelying Shoulder Flexion 15 Degrees  - 1 x daily - 3 x weekly - 2 sets - 10 reps  ASSESSMENT:  CLINICAL IMPRESSION: Provided pt more stretches to decrease anterior shoulder tightness. Pt had good response to K Tape so educated pt  and mother how to apply. Pt is no longer having his same superior neck/shoulder pain from initial eval (pt states this has no longer bothered him and generalized shoulder pain has improved). Pin point pain has has been more his anterior shoulder. Continued to work on posterior shoulder strengthening to decrease anterior impingement/strain. Pt will be following up with ortho next week -- will hold PT until further recs provided by ortho. Pt's posture still tends to have elevated scapulas with anterior tilt.   OBJECTIVE IMPAIRMENTS: decreased activity tolerance, decreased coordination, decreased endurance, decreased mobility, decreased ROM, decreased strength, increased fascial restrictions, increased muscle spasms, impaired flexibility, impaired UE functional use, improper body mechanics, postural dysfunction, and pain.   ACTIVITY LIMITATIONS: carrying, lifting, sleeping, bed mobility, bathing, toileting, dressing, reach over head, and hygiene/grooming  PARTICIPATION LIMITATIONS: meal prep, cleaning, laundry, community activity, occupation, and yard work  PERSONAL FACTORS: Age, Fitness, Past/current experiences, and Time since onset of injury/illness/exacerbation are also affecting patient's functional outcome.   REHAB POTENTIAL: Good  CLINICAL DECISION MAKING: Evolving/moderate complexity  EVALUATION COMPLEXITY:  Moderate   GOALS: Goals reviewed with patient? Yes  SHORT TERM GOALS: Target date: 12/12/2023   Pt will be ind with initial HEP Baseline:  Goal status: MET- 12/07/23  2.  Pt will demo full shoulder ROM without pain Baseline:  Goal status: MET- discomfort not pain 01/03/24  3.  Pt will be independent with maintaining shoulders down and back Baseline:  Goal status: MET- 01/03/24   LONG TERM GOALS: Target date: 01/02/2024 extended to 02/14/24   Pt will be ind with management and progression of HEP Baseline:  Goal status: INITIAL  2.  Pt will be able to lift and carry at least 20# without pain for work tasks Baseline:  Goal status: PARTIALLY MET- 01/03/24   3.  Pt will have improved L shoulder MMT to >/=4/5 to demo increasing postural stability Baseline:  Goal status: PARTIALLY MET 01/03/24  4.  Pt will have improved NDI to </=18% to demo MCID Baseline:  Goal status: MET- 12/21/23    PLAN:  PT FREQUENCY: 2x/week  PT DURATION: 6 weeks  PLANNED INTERVENTIONS: 16109- PT Re-evaluation, 97110-Therapeutic exercises, 97530- Therapeutic activity, 97112- Neuromuscular re-education, 97535- Self Care, 60454- Manual therapy, G0283- Electrical stimulation (unattended), 09811- Traction (mechanical), [id number]- Ionotophoresis 4mg /ml Dexamethasone , Patient/Family education, Taping, Dry Needling, Joint mobilization, Spinal mobilization, Cryotherapy, and Moist heat  PLAN FOR NEXT SESSION:  Work on lifting and endurance. Strengthen posterior shoulder girdle, stretch pecs/UTs, decrease UT compensation   Audri Kozub April Ma L Yousof Alderman, PT 02/08/2024, 3:33 PM

## 2024-02-23 NOTE — Progress Notes (Signed)
 Orthopedic Surgery Office Note  Chief Complaint  Patient presents with  . Left Shoulder - Pain     History of Present Illness:  33 y.o. male presents to clinic several months of left shoulder pain.  shoulder directly over the greater tuberosity extends down the lateral part of arm.  Denies any traumatic event.  Denies any feelings of instability or paresthesias.  States the pain is to the point where is interfering with activities of daily living including keeping them up at night.  Prior Treatment: Oral anti-inflammatories  History and Physical standard intake form from today's or previous visit is reviewed, pertinent information is noted and otherwise scanned into the patient's permanent medical record for future use.    Review of Systems:  Negative unless otherwise specified in HPI   Physical Exam:   General:  Alert and oriented x 3, NAD, well kempt, and of stated age.   Vital Signs:  BP (!) 132/91 (BP Location: Left arm, Patient Position: Sitting)   Pulse 87   Ht 1.88 m (6' 2)   Wt 127 kg (279 lb)   BMI 35.82 kg/m  , Body mass index is 35.82 kg/m.   HEENT:  Head is normocephalic and atraumatic. Extraocular muscles are grossly intact. Pupils are equal, round, and reactive to light and accommodation. Nares appeared normal. Mucous membranes are moist. Neck:  Supple, no visible swelling. Cardiovascular:  Hemodynamically stable. Respiratory:  No audible wheezing, unlabored respirations. Abdomen:  Soft, non-distended. Neurological:  Cranial nerves II through XII appear grossly intact.  Skin:  Clean and dry, well kempt. Psychiatric:  Good affect, stable, cooperative Extremities:  Pink without cyanosis, bilaterally.  Orthopedic Exam:   Orthopaedic: Left shoulder  Inspection: No asymmetry or deformity of the bilateral [place] joints, clavicles, or AC Joints. No prior incisions. No ecchymosis or erythema.  Palpation:  Tender to palpation along the biceps tendon as well as the  greater tuberosity Tender to palpation over the [facility] joint  AROM reveals:  Patient's range of motion is limited due to pain Of 170 degrees of forward flexion and abduction 30 degrees of external rotation and internal rotation to level of L3  Strength:   Deltoid fires in all 3 planes 4/5 strength in FF, AB, Int/Ext rotation Positive empty can Negative Hornblower belly press and lift off Negative drop arm test Positive Neer and Hawkins sign Positive cross chest abduction Equivocal Speed test Negative for apprehension, Kim and load and shift test   Cervical Spine: Pain with ROM: Negative Tenderness to palpation:  Negative Spurling's test:  Negative Lhermitte's sign:  Negative  Neurovascular Exam:  sensation intact to light touch in all peripheral nerve distributions and 2+ pulses.   Assessment:    History and physical exam is most consistent with left rotator cuff dysfunction/tendinitis.  Patient like continue conservative treatments.  Large joint arthrocentesis: L glenohumeral on 02/15/2024 2:00 PMIndications: pain Details: 22 G needle, posterior approach Medications: 4 mL ROPivacaine PF 5 mg/mL (0.5 %); 80 mg triamcinolone acetonide 40 mg/mL Outcome: tolerated well, no immediate complications Procedure, treatment alternatives, risks and benefits explained, specific risks discussed. Consent was given by the patient. Patient was prepped and draped in the usual sterile fashion.      Plan:    Left shoulder CSI RICE, Tylenol, occasional NSAID as needed for pain Physical therapy for left shoulder pain range of motion and strengthening all modalities  Medical and Social reviewed: Obesity, hypertension  Follow up: As needed or 4 - 6 weeks if not  better  Test Results X-rays of the left shoulder personally reviewed by me showed no acute fracture or malalignment joint spaces are maintained   Labs  No results found for: WBC, HGB, HCT, PLT  No results found for: NA,  K, CL, CO2, BUN, CREATININE, GLU, CALCIUM, MG, PHOS  No results found for: BILITOT, BILIDIR, PROT, ALBUMIN, ALT, AST, GGT  No results found for: LABPROT, INR, APTT  Allergies  Patient has no known allergies.   Medications    Current Medications[1]   Past Medical History  Past medical history reviewed and otherwise negative unless stated below.  Medical History[2]   Past Surgical History  Past surgical history reviewed and otherwise negative unless stated below.  Surgical History[3]   Family History  Family history reviewed and otherwise negative unless stated below.  Family History[4]   Social History:  Social history reviewed and otherwise negative unless stated below.  Social History   Socioeconomic History  . Marital status: Unknown    Spouse name: Not on file  . Number of children: Not on file  . Years of education: Not on file  . Highest education level: Not on file  Occupational History  . Not on file  Tobacco Use  . Smoking status: Never  . Smokeless tobacco: Never  Substance and Sexual Activity  . Alcohol use: Not on file  . Drug use: Not on file  . Sexual activity: Not on file  Other Topics Concern  . Not on file  Social History Narrative  . Not on file   Social Drivers of Health   Food Insecurity: Food Insecurity Present (12/27/2023)   Received from [facility]   Food vital sign   . Within the past 12 months, you worried that your food would run out before you got money to buy more: Sometimes true   . Within the past 12 months, the food you bought just didn't last and you didn't have money to get more: Never true  Transportation Needs: No Transportation Needs (12/27/2023)   Received from [facility] - Transportation   . Lack of Transportation (Medical): No   . Lack of Transportation (Non-Medical): No  Safety: Not on file  Living Situation: Low Risk  (12/27/2023)   Received from [facility] Situation   . In the last 12 months, was there a time when you were not able to pay the mortgage or rent on time?: No   . In the past 12 months, how many times have you moved where you were living?: 0   . At any time in the past 12 months, were you homeless or living in a shelter (including now)?: No         Problem List  Problem List[5]         [1] No current outpatient medications on file.   No current facility-administered medications for this visit.  [2] History reviewed. No pertinent past medical history. [3] History reviewed. No pertinent surgical history. [4] No family history on file. [5] There is no problem list on file for this patient.

## 2024-03-01 ENCOUNTER — Ambulatory Visit: Attending: Orthopedic Surgery | Admitting: Rehabilitation

## 2024-03-01 ENCOUNTER — Other Ambulatory Visit: Payer: Self-pay

## 2024-03-01 DIAGNOSIS — R293 Abnormal posture: Secondary | ICD-10-CM | POA: Diagnosis present

## 2024-03-01 DIAGNOSIS — M25512 Pain in left shoulder: Secondary | ICD-10-CM | POA: Diagnosis present

## 2024-03-01 DIAGNOSIS — G8929 Other chronic pain: Secondary | ICD-10-CM | POA: Diagnosis present

## 2024-03-01 DIAGNOSIS — M6281 Muscle weakness (generalized): Secondary | ICD-10-CM | POA: Diagnosis present

## 2024-03-01 NOTE — Therapy (Signed)
 OUTPATIENT PHYSICAL THERAPY UPPER EXTREMITY EVALUATION   Patient Name: William Howe MRN: [id number] DOB:[birth year], 33 y.o., male Today's Date: 03/01/2024  END OF SESSION:  PT End of Session - 03/01/24 1614     Visit Number 16    Number of Visits 18    Date for PT Re-Evaluation 02/14/24    Authorization Type [employer]    PT Start Time 1515    PT Stop Time 1605    PT Time Calculation (min) 50 min    Activity Tolerance Patient tolerated treatment well    Behavior During Therapy [facility] for tasks assessed/performed          No past medical history on file. Past Surgical History:  Procedure Laterality Date   HAND SURGERY     TOE SURGERY     There are no active problems to display for this patient.   PCP: Jason Leita Repine, FNP   REFERRING PROVIDER: Alexandra Lonni LABOR, *  REFERRING DIAG:  [id number] (ICD-10-CM) - Pain in left shoulder    THERAPY DIAG:  Chronic left shoulder pain  Muscle weakness (generalized)  Abnormal posture  RATIONALE FOR EVALUATION AND TREATMENT: Rehabilitation  ONSET DATE: 2/25  NEXT MD VISIT:     f/u PRN   SUBJECTIVE:                                                                                                                                                                                                         SUBJECTIVE STATEMENT:   Pt. Is a 33 y/o male referred to PT from Dr Alexandra for L RTC tendonitis/ L shoulder pain.  PT has been doing PT here with us  for the last couple of months.  He was initially referred to us  from Dr Colon for L neck pain.    States Pain started for no apparent reason in 2/25.  States Went ER then to neurosurgery, had cervical MRI showing mild disc bulging.   Did well with PT here and had good results with his L upper trap/neck pain.   However, he is still having some L neck pain, but c/o more of L shoulder pain.   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    States does a lot of heavy lifting and reaching when cooking which is hurting considerably.  He has a good HEP for neck stretching and has been doing this.   He is frustrated that his symptoms have not resolved in 5 months.  saw Dr Alexandra who  feels he has some RTC pathology and requested further PT To address this issue.    Pt. States he received a L shoulder injection but doesn't feel it helped.  States pain is intermittent but worst with work.   Sitting and resting can relieve the pain completely at times, but almost always has a dull achy pain in the L anterior shoulder.  He sttaes the pain is from the lateral greater tuberosity area to anteriorly into the pec area.  However, he still does have some L sided neck pain.    PAIN: Are you having pain? Yes: NPRS scale: 1/10 now;  worst 7/10 Pain location: L shoulder anteriorly Pain description: dull always, sharp with lifting and reaching forward Aggravating factors: lifting, repeated reaching Relieving factors: sitting and resting  PERTINENT HISTORY:  noncontributory  PRECAUTIONS: None  HAND DOMINANCE: Right  RED FLAGS: None  WEIGHT BEARING RESTRICTIONS: No  FALLS:  Has patient fallen in last 6 months? 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 Left arm weakness. No known injury.   EXAM: MRI CERVICAL SPINE WITHOUT CONTRAST   TECHNIQUE: Multiplanar, multisequence MR imaging of the cervical spine was performed. No  intravenous contrast was administered.   COMPARISON:  None Available.   FINDINGS: Alignment: Normal.   Vertebrae: No fracture, evidence of discitis, or bone lesion.   Cord: Normal signal throughout.   Posterior Fossa, vertebral arteries, paraspinal tissues: Negative.   Disc levels:   The central canal and foramina are widely patent at all levels. Disc height and hydration are maintained throughout. Minimal disc bulging C5-6 and C6-7 is noted.   IMPRESSION: Minimal disc bulging C5-6 and C6-7. The exam is otherwise negative. The central canal and foramina are widely patent at all levels.     Electronically Signed   By: Debby Prader M.D.   On: 12/06/2023 09:02  PATIENT SURVEYS:  Quick dash = 31.8  COGNITION: Overall cognitive status: Within functional limits for tasks assessed     SENSATION: WFL  POSTURE: Some rounded shoulder posture  CERVICAL ROM:   Active ROM A/PROM (deg) eval  Flexion 90  Extension 50  Right lateral flexion 75  Left lateral flexion 75  Right rotation 50  Left rotation 60   (Blank rows = not tested)   UPPER EXTREMITY ROM:   Active ROM Right eval Left eval  Shoulder flexion 170 170  Shoulder extension 50 50  Shoulder abduction 175 175  Shoulder adduction    Shoulder internal rotation To T10 T10  Shoulder external rotation C7 c7  Elbow flexion    Elbow extension    Wrist flexion    Wrist extension    Wrist ulnar deviation    Wrist radial deviation    Wrist pronation    Wrist supination    (Blank rows = not tested)  Active ROM Right eval Left eval  Thumb MCP (0-60)    Thumb IP (0-80)    Thumb Radial abd/add (0-55)     Thumb Palmar  abd/add (0-45)     Thumb Opposition to Small Finger     Index MCP (0-90)     Index PIP (0-100)     Index DIP (0-70)      Long MCP (0-90)      Long PIP (0-100)      Long DIP (0-70)      Ring MCP (0-90)      Ring PIP (0-100)      Ring DIP (0-70)      Little MCP (0-90)      Little PIP  (0-100)      Little DIP (0-70)      (Blank rows = not tested)  UPPER EXTREMITY MMT:  MMT Right eval Left eval  Shoulder flexion 4 4  Shoulder extension  4-  Shoulder abduction 4 4-  Shoulder adduction    Shoulder internal rotation 5 5  Shoulder external rotation 4+ 4  Middle trapezius  3+  Lower trapezius  3+  Elbow flexion 5 5  Elbow extension 5 5  Wrist flexion 5 5  Wrist extension 5 5  Wrist ulnar deviation    Wrist radial deviation    Wrist pronation    Wrist supination    Grip strength (lbs) =BUE   (Blank rows = not tested)  SHOULDER SPECIAL TESTS: Impingement tests: Neer impingement test: positive , Hawkins/Kennedy impingement test: negative, and Painful arc test: negative SLAP lesions: Biceps load test: negative Instability tests: Apprehension test: negative and Sulcus sign: negative Rotator cuff assessment: Drop arm test: negative, Empty can test: positive , Full can test: positive , Gerber lift off test: negative, Infraspinatus test: negative, and Hornblower's sign: negative Biceps assessment: Yergason's test: negative and Speed's test: positive  A/C shear test is negative Scapular movement is difficult to assess due to large body habitus, but appears normal without winging, tipping Spurlings is negative to the R, but positive in neutral and to the L Cervical Distraction is negative  ULTT A and B are both negative LUE  JOINT MOBILITY TESTING:   Decreased posterior glide of the humerus, but otherwise feels hypermobile  PALPATION:  TTP over the L lateral cervical transverse processes and scalenes; L greater tuberosity around the anterior shoulder to subacromial space    TODAY'S TREATMENT:  03/02/24 THERAPEUTIC EXERCISE: To improve strength.  Demonstration, verbal and tactile cues throughout for technique.   PATIENT EDUCATION:  Education details: PT eval findings, anticipated POC, and initial HEP  Person educated: Patient Education method: Explanation,  Demonstration, Verbal cues, Tactile cues, and MedBridgeGO app access provided Education comprehension: verbalized understanding, verbal cues required, tactile cues required, and needs further education  HOME EXERCISE PROGRAM: Access Code: [email or web address] URL: [email or web address] Date: 03/02/2024 Prepared by: Garnette Montclair  Exercises - Prone Shoulder Extension - Single Arm  - 1 x daily - 7 x weekly - 3 sets - 10 reps - Prone Shoulder Horizontal Abduction  - 1 x daily - 7 x weekly - 3 sets - 10 reps - Prone Single Arm Shoulder Y  - 1 x daily - 7 x weekly - 3 sets - 10 reps - Prone Shoulder Row  - 1 x daily - 7 x weekly - 3 sets - 10 reps - Prone Shoulder External Rotation  - 1 x daily - 7 x weekly - 3 sets - 10 reps   ASSESSMENT:  CLINICAL IMPRESSION: William Howe is a 33 y.o. male who was referred to physical therapy for evaluation and treatment for L RTC tendonitis/L shoulder pain  Patient reports onset of R shoulder pain beginning 2/25 for no reason. Pain is worse with any lifting or reaching forward to cook, etc.  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  He has significantly weak periscapular musculature of 3+/5.    He still has L neck pain with positive Spurling's test as well as the positive RTC/impingement tests.   He is also quite hypermobile with passive ER/IR are well past 90 degrees.   Patient has deficits in cervical ROM, hyper flexibility, L shoulder strength, abnormal forward head posture, and TTP at L neck and shoulder which are interfering with ADLs and are impacting quality of life.  On QuickDASH patient scored 31.8/100 demonstrating 31.8% disability.  William Howe will benefit from skilled PT to address above deficits to improve mobility and activity tolerance with decreased pain interference.  OBJECTIVE IMPAIRMENTS: decreased ROM, decreased strength, impaired UE functional use, postural  dysfunction, and pain.   ACTIVITY LIMITATIONS: lifting, reach over head, and working at [employer]  PARTICIPATION LIMITATIONS: meal prep, cleaning, laundry, and occupation  PERSONAL FACTORS: Fitness and Time since onset of injury/illness/exacerbation are also affecting patient's functional outcome.   REHAB POTENTIAL: Good  CLINICAL DECISION MAKING: Evolving/moderate complexity  EVALUATION COMPLEXITY: Moderate   GOALS: Goals reviewed with patient? Yes  SHORT TERM GOALS: Target date: 03/30/2024  Patient will be independent with initial HEP to improve outcomes and carryover.  Baseline: 100% PT assist required for HEP completion with correct form  Goal status: INITIAL  2.  Patient will report 25% improvement in L neck and shoulder pain to improve QOL.  Baseline: 7/10 at work Goal status: INITIAL   LONG TERM GOALS: Target date: 04/27/2024   Patient will be independent with ongoing/advanced HEP for self-management at home.  Baseline: no periscapular advanced HEP yet Goal status: INITIAL  2.  Patient will report 50-75% improvement in L neck and shoulder pain to improve QOL.  Baseline: 7/10 worst Goal status: INITIAL  3.  Patient to demonstrate improved upright posture with posterior shoulder girdle engaged to promote improved functional UE use. Baseline: forward head with all movements Goal status: INITIAL  4.  Patient to improve cervical AROM to 100% without pain provocation to allow for increased ease of ADLs.  Baseline: Refer to above UE ROM table Goal status: INITIAL  5.  Patient will demonstrate improved L shoulder strength to >/= 4+/5 for functional UE use. Baseline: Refer to above UE MMT table Goal status: INITIAL  6  Patient will report </= 50% on QuickDASH (MCID = 14%) overall more so to demonstrate improved functional ability.  Baseline: 31.8 Goal status: INITIAL  7.  Patient will be able to lift 5-20 lbs overhead repeatedly for 15-10 minutes to be able to work full  shift at [employer] with minimal to no pain Baseline: pain after 1-2 hrs of work Goal Status:  INITIAL  PLAN:  PT FREQUENCY: 1-2x/week  PT DURATION: 8 weeks  PLANNED INTERVENTIONS: 97164- PT Re-evaluation, 97750- Physical Performance Testing, 97110-Therapeutic exercises, 97530- Therapeutic activity, [id number]- Neuromuscular re-education, [id number]- Self Care, 02859- Manual therapy, G0283- Electrical stimulation (unattended), 97016- Vasopneumatic device, [id number]- Ultrasound, [id number]- Traction (mechanical), [id number]- Ionotophoresis 4mg /ml Dexamethasone , 79439 (1-2 muscles), 20561 (3+ muscles)- Dry Needling, Patient/Family education, Taping, Joint mobilization, Spinal mobilization, Cryotherapy, Moist heat, and Biofeedback  PLAN FOR NEXT SESSION: Review/progress HEP; L shoulder strengthening; Cervical stretching into extension; Traction; postural KT taping   Bryndan Bilyk,  PT 03/01/2024, 4:16 PM

## 2024-03-14 ENCOUNTER — Ambulatory Visit: Admitting: Rehabilitation

## 2024-03-14 DIAGNOSIS — M6281 Muscle weakness (generalized): Secondary | ICD-10-CM

## 2024-03-14 DIAGNOSIS — R293 Abnormal posture: Secondary | ICD-10-CM

## 2024-03-14 DIAGNOSIS — M25512 Pain in left shoulder: Secondary | ICD-10-CM | POA: Diagnosis not present

## 2024-03-14 DIAGNOSIS — G8929 Other chronic pain: Secondary | ICD-10-CM

## 2024-03-14 NOTE — Therapy (Signed)
 OUTPATIENT PHYSICAL THERAPY UPPER EXTREMITY EVALUATION   Patient Name: William Howe MRN: [id number] DOB:Dec 24, 1990, 33 y.o., male Today's Date: 03/14/2024  END OF SESSION:  PT End of Session - 03/14/24 1520     Visit Number 17    Number of Visits 18    Date for PT Re-Evaluation 02/14/24    Authorization Type [employer]    PT Start Time 1521    PT Stop Time 1615    PT Time Calculation (min) 54 min    Activity Tolerance Patient tolerated treatment well    Behavior During Therapy [facility] for tasks assessed/performed          No past medical history on file. Past Surgical History:  Procedure Laterality Date   HAND SURGERY     TOE SURGERY     There are no active problems to display for this patient.   PCP: Jason Leita Repine, FNP   REFERRING PROVIDER: Alexandra Lonni LABOR, *  REFERRING DIAG:  [phone] (ICD-10-CM) - Pain in left shoulder    THERAPY DIAG:  Chronic left shoulder pain  Muscle weakness (generalized)  Abnormal posture  RATIONALE FOR EVALUATION AND TREATMENT: Rehabilitation  ONSET DATE: 2/25  NEXT MD VISIT:     f/u PRN   SUBJECTIVE:                                                                                                                                                                                                         SUBJECTIVE STATEMENT:   03/14/24:  Pt. Reports feels slightly better.   He has been off work since Sunday so he is feeling pretty good today with pain only 1/10.   Denies any further injuries or falls.    EVAL:  Pt. Is a 33 y/o male referred to PT from Dr Alexandra for L RTC tendonitis/ L shoulder pain.  PT has been doing PT here with us  for the last couple of months.  He was initially referred to us  from Dr Colon for L neck pain.    States Pain started for no apparent reason in 2/25.  States Went ER then to neurosurgery, had cervical MRI showing mild disc bulging.   Did well with PT here and had good results with his L upper  trap/neck pain.   However, he is still having some L neck pain, but c/o more of L shoulder pain.   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   States does a lot of heavy lifting and  reaching when cooking which is hurting considerably.  He has a good HEP for neck stretching and has been doing this.   He is frustrated that his symptoms have not resolved in 5 months.  saw Dr Alexandra who feels he has some RTC pathology and requested further PT To address this issue.    Pt. States he received a L shoulder injection but doesn't feel it helped.  States pain is intermittent but worst with work.   Sitting and resting can relieve the pain completely at times, but almost always has a dull achy pain in the L anterior shoulder.  He sttaes the pain is from the lateral greater tuberosity area to anteriorly into the pec area.  However, he still does have some L sided neck pain.    PAIN: Are you having pain? Yes: NPRS scale: 1/10 now;  worst 7/10 Pain location: L shoulder anteriorly Pain description: dull always, sharp with lifting and reaching forward Aggravating factors: lifting, repeated reaching Relieving factors: sitting and resting  PERTINENT HISTORY:  noncontributory  PRECAUTIONS: None  HAND DOMINANCE: Right  RED FLAGS: None  WEIGHT BEARING RESTRICTIONS: No  FALLS:  Has patient fallen in last 6 months? 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 Left arm weakness. No known injury.   EXAM: MRI CERVICAL SPINE WITHOUT CONTRAST   TECHNIQUE: Multiplanar, multisequence MR imaging of the cervical spine was performed. No intravenous contrast was administered.   COMPARISON:  None Available.   FINDINGS: Alignment: Normal.   Vertebrae: No fracture, evidence of discitis, or bone lesion.   Cord: Normal signal throughout.   Posterior Fossa, vertebral arteries, paraspinal tissues: Negative.   Disc levels:   The central canal and foramina are widely patent at all levels. Disc height and hydration are maintained throughout. Minimal disc bulging C5-6 and C6-7 is noted.   IMPRESSION: Minimal disc bulging C5-6 and C6-7. The exam is otherwise negative. The central canal and foramina are widely patent at all levels.     Electronically Signed   By: Debby Prader M.D.   On: 12/06/2023 09:02  PATIENT SURVEYS:  Quick dash = 31.8  COGNITION: Overall cognitive status: Within functional limits for tasks assessed     SENSATION: WFL  POSTURE: Some rounded shoulder posture  CERVICAL ROM:   Active ROM A/PROM (deg) eval  Flexion 90  Extension 50  Right lateral flexion 75  Left lateral flexion 75  Right rotation 50  Left rotation 60   (Blank rows = not tested)   UPPER EXTREMITY ROM:   Active ROM Right eval Left eval  Shoulder flexion 170 170  Shoulder extension 50 50  Shoulder abduction 175 175  Shoulder adduction    Shoulder internal rotation To T10 T10  Shoulder external rotation C7 c7  Elbow flexion    Elbow extension    Wrist flexion    Wrist extension    Wrist ulnar deviation    Wrist radial deviation    Wrist pronation  Wrist supination    (Blank rows = not tested)  Active ROM Right eval Left eval  Thumb MCP (0-60)    Thumb IP (0-80)    Thumb Radial abd/add (0-55)     Thumb Palmar abd/add (0-45)     Thumb Opposition to Small Finger     Index MCP (0-90)     Index PIP (0-100)     Index DIP  (0-70)      Long MCP (0-90)      Long PIP (0-100)      Long DIP (0-70)      Ring MCP (0-90)      Ring PIP (0-100)      Ring DIP (0-70)      Little MCP (0-90)      Little PIP (0-100)      Little DIP (0-70)      (Blank rows = not tested)  UPPER EXTREMITY MMT:  MMT Right eval Left eval  Shoulder flexion 4 4  Shoulder extension  4-  Shoulder abduction 4 4-  Shoulder adduction    Shoulder internal rotation 5 5  Shoulder external rotation 4+ 4  Middle trapezius  3+  Lower trapezius  3+  Elbow flexion 5 5  Elbow extension 5 5  Wrist flexion 5 5  Wrist extension 5 5  Wrist ulnar deviation    Wrist radial deviation    Wrist pronation    Wrist supination    Grip strength (lbs) =BUE   (Blank rows = not tested)  SHOULDER SPECIAL TESTS: Impingement tests: Neer impingement test: positive , Hawkins/Kennedy impingement test: negative, and Painful arc test: negative SLAP lesions: Biceps load test: negative Instability tests: Apprehension test: negative and Sulcus sign: negative Rotator cuff assessment: Drop arm test: negative, Empty can test: positive , Full can test: positive , Gerber lift off test: negative, Infraspinatus test: negative, and Hornblower's sign: negative Biceps assessment: Yergason's test: negative and Speed's test: positive  A/C shear test is negative Scapular movement is difficult to assess due to large body habitus, but appears normal without winging, tipping Spurlings is negative to the R, but positive in neutral and to the L Cervical Distraction is negative  ULTT A and B are both negative LUE  JOINT MOBILITY TESTING:   Decreased posterior glide of the humerus, but otherwise feels hypermobile  PALPATION:  TTP over the L lateral cervical transverse processes and scalenes; L greater tuberosity around the anterior shoulder to subacromial space    TODAY'S TREATMENT:  03/14/24 THERAPEUTIC EXERCISE: To improve strength.  Demonstration, verbal and tactile cues  throughout for technique. UBE L3 3'F/3'B  THERAPEUTIC ACTIVITIES: To improve functional performance.  Demonstration, verbal and tactile cues throughout for technique. Prone Shoulder Extension - Single Arm  1# x 3/10 LUE - Prone Shoulder Horizontal Abduction  1# x 3/10 LUE - Prone Single Arm Shoulder Y  1# x 3/10 LUE  Prone Shoulder Row  10# x 3/10 LUE - Prone Shoulder External Rotation at 90/90 1# x 3/10 LUE Seated Lat PD Blue TB x 20 LUE Counter push ups x 20 BUE  NEUROMUSCULAR RE-EDUCATION: To improve posture. Seated scapular clocks  Wall ball CW x 15; CCW x 15 LUE Wall clocks YTB LUE and RUE x 2 from 6:00 - 12:00  Intermittent Cervical traction 60 sec on/20 sec off @ 20lb pull x 15'  03/02/24 THERAPEUTIC EXERCISE: To improve strength.  Demonstration, verbal and tactile cues throughout for technique.   PATIENT EDUCATION:  Education details: PT eval  findings, anticipated POC, and initial HEP  Person educated: Patient Education method: Explanation, Demonstration, Verbal cues, Tactile cues, and MedBridgeGO app access provided Education comprehension: verbalized understanding, verbal cues required, tactile cues required, and needs further education  HOME EXERCISE PROGRAM: Access Code: [email or web address] URL: [email or web address] Date: 03/02/2024 Prepared by: Garnette Montclair  Exercises - Prone Shoulder Extension - Single Arm  - 1 x daily - 7 x weekly - 3 sets - 10 reps - Prone Shoulder Horizontal Abduction  - 1 x daily - 7 x weekly - 3 sets - 10 reps - Prone Single Arm Shoulder Y  - 1 x daily - 7 x weekly - 3 sets - 10 reps - Prone Shoulder Row  - 1 x daily - 7 x weekly - 3 sets - 10 reps - Prone Shoulder External Rotation  - 1 x daily - 7 x weekly - 3 sets - 10 reps   ASSESSMENT:  CLINICAL IMPRESSION: 03/14/24:  Reviewed all HEP and patient needs tactile cues/manual assist for correct form on all HEP except for prone rowing.  Further review and teaching is needed.    Added scapular clocks.  Also added Cervical traction to see if there is any neurological component to his shoulder pain.   EVAL:  Zade Falkner is a 33 y.o. male who was referred to physical therapy for evaluation and treatment for L RTC tendonitis/L shoulder pain   Patient reports onset of R shoulder pain beginning 2/25 for no reason. Pain is worse with any lifting or reaching forward to cook, etc.  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  He has significantly weak periscapular musculature of 3+/5.    He still has L neck pain with positive Spurling's test as well as the positive RTC/impingement tests.   He is also quite hypermobile with passive ER/IR are well past 90 degrees.   Patient has deficits in cervical ROM, hyper flexibility, L shoulder strength, abnormal forward head posture, and TTP at L neck and shoulder which are interfering with ADLs and are impacting quality of life.  On QuickDASH patient scored 31.8/100 demonstrating 31.8% disability.  Jarone will benefit from skilled PT to address above deficits to improve mobility and activity tolerance with decreased pain interference.  OBJECTIVE IMPAIRMENTS: decreased ROM, decreased strength, impaired UE functional use, postural dysfunction, and pain.   ACTIVITY LIMITATIONS: lifting, reach over head, and working at [employer]  PARTICIPATION LIMITATIONS: meal prep, cleaning, laundry, and occupation  PERSONAL FACTORS: Fitness and Time since onset of injury/illness/exacerbation are also affecting patient's functional outcome.   REHAB POTENTIAL: Good  CLINICAL DECISION MAKING: Evolving/moderate complexity  EVALUATION COMPLEXITY: Moderate   GOALS: Goals reviewed with patient? Yes  SHORT TERM GOALS: Target date: 03/30/2024  Patient will be independent with initial HEP to improve outcomes and carryover.  Baseline: 100% PT assist required for HEP completion with correct  form  Goal status: INITIAL  2.  Patient will report 25% improvement in L neck and shoulder pain to improve QOL.  Baseline: 7/10 at work Goal status: INITIAL   LONG TERM GOALS: Target date: 04/27/2024   Patient will be independent with ongoing/advanced HEP for self-management at home.  Baseline: no periscapular advanced HEP yet Goal status: INITIAL  2.  Patient will report 50-75% improvement in L neck and shoulder pain to improve QOL.  Baseline: 7/10 worst Goal status: INITIAL  3.  Patient to demonstrate  improved upright posture with posterior shoulder girdle engaged to promote improved functional UE use. Baseline: forward head with all movements Goal status: INITIAL  4.  Patient to improve cervical AROM to 100% without pain provocation to allow for increased ease of ADLs.  Baseline: Refer to above UE ROM table Goal status: INITIAL  5.  Patient will demonstrate improved L shoulder strength to >/= 4+/5 for functional UE use. Baseline: Refer to above UE MMT table Goal status: INITIAL  6  Patient will report </= 50% on QuickDASH (MCID = 14%) overall more so to demonstrate improved functional ability.  Baseline: 31.8 Goal status: INITIAL  7.  Patient will be able to lift 5-20 lbs overhead repeatedly for 15-10 minutes to be able to work full shift at [employer] with minimal to no pain Baseline: pain after 1-2 hrs of work Goal Status:  INITIAL  PLAN:  PT FREQUENCY: 1-2x/week  PT DURATION: 8 weeks  PLANNED INTERVENTIONS: 97164- PT Re-evaluation, 97750- Physical Performance Testing, 97110-Therapeutic exercises, 97530- Therapeutic activity, [id number]- Neuromuscular re-education, [id number]- Self Care, 02859- Manual therapy, G0283- Electrical stimulation (unattended), 97016- Vasopneumatic device, [id number]- Ultrasound, [id number]- Traction (mechanical), [id number]- Ionotophoresis 4mg /ml Dexamethasone , 79439 (1-2 muscles), 20561 (3+ muscles)- Dry Needling, Patient/Family education, Taping, Joint mobilization,  Spinal mobilization, Cryotherapy, Moist heat, and Biofeedback  PLAN FOR NEXT SESSION: Reassess if traction had any lasting effects, Try KT taping, continue with shoulder and scapular strengthening  Jayli Fogleman, PT 03/14/2024, 4:11 PM

## 2024-03-21 ENCOUNTER — Ambulatory Visit

## 2024-03-21 DIAGNOSIS — R293 Abnormal posture: Secondary | ICD-10-CM

## 2024-03-21 DIAGNOSIS — M6281 Muscle weakness (generalized): Secondary | ICD-10-CM

## 2024-03-21 DIAGNOSIS — G8929 Other chronic pain: Secondary | ICD-10-CM

## 2024-03-21 DIAGNOSIS — M25512 Pain in left shoulder: Secondary | ICD-10-CM

## 2024-03-21 NOTE — Therapy (Signed)
 OUTPATIENT PHYSICAL THERAPY UPPER EXTREMITY TREATMENT   Patient Name: William Howe MRN: [id number] DOB:Jan 03, 1991, 33 y.o., male Today's Date: 03/21/2024  END OF SESSION:  PT End of Session - 03/21/24 1532     Visit Number 18    Number of Visits 18    Date for PT Re-Evaluation 04/27/24    Authorization Type [employer]    PT Start Time 1530    PT Stop Time 1618    PT Time Calculation (min) 48 min    Activity Tolerance Patient tolerated treatment well    Behavior During Therapy [facility] for tasks assessed/performed           History reviewed. No pertinent past medical history. Past Surgical History:  Procedure Laterality Date   HAND SURGERY     TOE SURGERY     There are no active problems to display for this patient.   PCP: Jason Leita Repine, FNP   REFERRING PROVIDER: Alexandra Lonni LABOR, *  REFERRING DIAG:  [phone] (ICD-10-CM) - Pain in left shoulder    THERAPY DIAG:  Chronic left shoulder pain  Muscle weakness (generalized)  Abnormal posture  Acute pain of left shoulder  RATIONALE FOR EVALUATION AND TREATMENT: Rehabilitation  ONSET DATE: 2/25  NEXT MD VISIT:     f/u PRN   SUBJECTIVE:                                                                                                                                                                                                         SUBJECTIVE STATEMENT:    Pt reports work being strenuous d/t covering shifts for others.    EVAL:  Pt. Is a 33 y/o male referred to PT from Dr Alexandra for L RTC tendonitis/ L shoulder pain.  PT has been doing PT here with us  for the last couple of months.  He was initially referred to us  from Dr Colon for L neck pain.    States Pain started for no apparent reason in 2/25.  States Went ER then to neurosurgery, had cervical MRI showing mild disc bulging.   Did well with PT here and had good results with his L upper trap/neck pain.   However, he is still having some L neck  pain, but c/o more of L shoulder pain.   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   States does a lot of heavy lifting and reaching when cooking which is hurting considerably.  He has a good HEP for neck stretching  and has been doing this.   He is frustrated that his symptoms have not resolved in 5 months.  saw Dr Alexandra who feels he has some RTC pathology and requested further PT To address this issue.    Pt. States he received a L shoulder injection but doesn't feel it helped.  States pain is intermittent but worst with work.   Sitting and resting can relieve the pain completely at times, but almost always has a dull achy pain in the L anterior shoulder.  He sttaes the pain is from the lateral greater tuberosity area to anteriorly into the pec area.  However, he still does have some L sided neck pain.    PAIN: Are you having pain? Yes: NPRS scale: 1/10 now;  worst 7/10 Pain location: L shoulder anteriorly Pain description: dull always, sharp with lifting and reaching forward Aggravating factors: lifting, repeated reaching Relieving factors: sitting and resting  PERTINENT HISTORY:  noncontributory  PRECAUTIONS: None  HAND DOMINANCE: Right  RED FLAGS: None  WEIGHT BEARING RESTRICTIONS: No  FALLS:  Has patient fallen in last 6 months? 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 Left arm weakness. No known injury.   EXAM: MRI  CERVICAL SPINE WITHOUT CONTRAST   TECHNIQUE: Multiplanar, multisequence MR imaging of the cervical spine was performed. No intravenous contrast was administered.   COMPARISON:  None Available.   FINDINGS: Alignment: Normal.   Vertebrae: No fracture, evidence of discitis, or bone lesion.   Cord: Normal signal throughout.   Posterior Fossa, vertebral arteries, paraspinal tissues: Negative.   Disc levels:   The central canal and foramina are widely patent at all levels. Disc height and hydration are maintained throughout. Minimal disc bulging C5-6 and C6-7 is noted.   IMPRESSION: Minimal disc bulging C5-6 and C6-7. The exam is otherwise negative. The central canal and foramina are widely patent at all levels.     Electronically Signed   By: Debby Prader M.D.   On: 12/06/2023 09:02  PATIENT SURVEYS:  Quick dash = 31.8  COGNITION: Overall cognitive status: Within functional limits for tasks assessed     SENSATION: WFL  POSTURE: Some rounded shoulder posture  CERVICAL ROM:   Active ROM A/PROM (deg) eval  Flexion 90  Extension 50  Right lateral flexion 75  Left lateral flexion 75  Right rotation 50  Left rotation 60   (Blank rows = not tested)   UPPER EXTREMITY ROM:   Active ROM Right eval Left eval  Shoulder flexion 170 170  Shoulder extension 50 50  Shoulder abduction 175 175  Shoulder adduction    Shoulder internal rotation To T10 T10  Shoulder external rotation C7 c7  Elbow flexion    Elbow extension    Wrist flexion    Wrist extension    Wrist ulnar deviation    Wrist radial deviation    Wrist pronation    Wrist supination    (Blank rows = not tested)  Active ROM Right eval Left  eval  Thumb MCP (0-60)    Thumb IP (0-80)    Thumb Radial abd/add (0-55)     Thumb Palmar abd/add (0-45)     Thumb Opposition to Small Finger     Index MCP (0-90)     Index PIP (0-100)     Index DIP (0-70)      Long MCP (0-90)      Long PIP (0-100)       Long DIP (0-70)      Ring MCP (0-90)      Ring PIP (0-100)      Ring DIP (0-70)      Little MCP (0-90)      Little PIP (0-100)      Little DIP (0-70)      (Blank rows = not tested)  UPPER EXTREMITY MMT:  MMT Right eval Left eval  Shoulder flexion 4 4  Shoulder extension  4-  Shoulder abduction 4 4-  Shoulder adduction    Shoulder internal rotation 5 5  Shoulder external rotation 4+ 4  Middle trapezius  3+  Lower trapezius  3+  Elbow flexion 5 5  Elbow extension 5 5  Wrist flexion 5 5  Wrist extension 5 5  Wrist ulnar deviation    Wrist radial deviation    Wrist pronation    Wrist supination    Grip strength (lbs) =BUE   (Blank rows = not tested)  SHOULDER SPECIAL TESTS: Impingement tests: Neer impingement test: positive , Hawkins/Kennedy impingement test: negative, and Painful arc test: negative SLAP lesions: Biceps load test: negative Instability tests: Apprehension test: negative and Sulcus sign: negative Rotator cuff assessment: Drop arm test: negative, Empty can test: positive , Full can test: positive , Gerber lift off test: negative, Infraspinatus test: negative, and Hornblower's sign: negative Biceps assessment: Yergason's test: negative and Speed's test: positive  A/C shear test is negative Scapular movement is difficult to assess due to large body habitus, but appears normal without winging, tipping Spurlings is negative to the R, but positive in neutral and to the L Cervical Distraction is negative  ULTT A and B are both negative LUE  JOINT MOBILITY TESTING:   Decreased posterior glide of the humerus, but otherwise feels hypermobile  PALPATION:  TTP over the L lateral cervical transverse processes and scalenes; L greater tuberosity around the anterior shoulder to subacromial space    TODAY'S TREATMENT:  03/21/24 THERAPEUTIC EXERCISE: To improve strength.  Demonstration, verbal and tactile cues throughout for technique. UBE L2 3'F/3'B  NEUROMUSCULAR  RE-EDUCATION: To improve posture.  Prone Shoulder Extension - Single Arm  2# x 2/10 LUE - Prone Shoulder Horizontal Abduction  2# x 2/10 LUE - Prone Single Arm Shoulder Y  2# x 2/10 LUE Sidelying L shoulder ER towel at side 2x10 3LB       Wall ball CW/CCW 2x30 L UE Wall push up with pball x 10 Standing scapular clocks YTB x 5 BUE Manual Therapy:  KT tape for shoulder stability  03/14/24 THERAPEUTIC EXERCISE: To improve strength.  Demonstration, verbal and tactile cues throughout for technique. UBE L3 3'F/3'B  THERAPEUTIC ACTIVITIES: To improve functional performance.  Demonstration, verbal and tactile cues throughout for technique. Prone Shoulder Extension - Single Arm  1# x 3/10 LUE - Prone Shoulder Horizontal Abduction  1# x 3/10 LUE - Prone Single Arm Shoulder Y  1# x 3/10 LUE  Prone Shoulder Row  10# x 3/10 LUE - Prone Shoulder External Rotation at 90/90 1# x 3/10  LUE Seated Lat PD Blue TB x 20 LUE Counter push ups x 20 BUE  NEUROMUSCULAR RE-EDUCATION: To improve posture. Seated scapular clocks  Wall ball CW x 15; CCW x 15 LUE Wall clocks YTB LUE and RUE x 2 from 6:00 - 12:00  Intermittent Cervical traction 60 sec on/20 sec off @ 20lb pull x 15'  03/02/24 THERAPEUTIC EXERCISE: To improve strength.  Demonstration, verbal and tactile cues throughout for technique.   PATIENT EDUCATION:  Education details: PT eval findings, anticipated POC, and initial HEP  Person educated: Patient Education method: Explanation, Demonstration, Verbal cues, Tactile cues, and MedBridgeGO app access provided Education comprehension: verbalized understanding, verbal cues required, tactile cues required, and needs further education  HOME EXERCISE PROGRAM: Access Code: [email or web address] URL: [email or web address] Date: 03/02/2024 Prepared by: Garnette Montclair  Exercises - Prone Shoulder Extension - Single Arm  - 1 x daily - 7 x weekly - 3 sets - 10 reps - Prone Shoulder Horizontal  Abduction  - 1 x daily - 7 x weekly - 3 sets - 10 reps - Prone Single Arm Shoulder Y  - 1 x daily - 7 x weekly - 3 sets - 10 reps - Prone Shoulder Row  - 1 x daily - 7 x weekly - 3 sets - 10 reps - Prone Shoulder External Rotation  - 1 x daily - 7 x weekly - 3 sets - 10 reps   ASSESSMENT:  CLINICAL IMPRESSION: Continued progressing strength for L shoulder as tolerated. Pt with some pain doing Y raises and scapular clocks. Applied KT tape to L shoulder again for some shoulder stabilization. Pt responded well to treatment.   EVAL:  Coolidge Gossard is a 33 y.o. male who was referred to physical therapy for evaluation and treatment for L RTC tendonitis/L shoulder pain   Patient reports onset of R shoulder pain beginning 2/25 for no reason. Pain is worse with any lifting or reaching forward to cook, etc.  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  He has significantly weak periscapular musculature of 3+/5.    He still has L neck pain with positive Spurling's test as well as the positive RTC/impingement tests.   He is also quite hypermobile with passive ER/IR are well past 90 degrees.   Patient has deficits in cervical ROM, hyper flexibility, L shoulder strength, abnormal forward head posture, and TTP at L neck and shoulder which are interfering with ADLs and are impacting quality of life.  On QuickDASH patient scored 31.8/100 demonstrating 31.8% disability.  Hiro will benefit from skilled PT to address above deficits to improve mobility and activity tolerance with decreased pain interference.  OBJECTIVE IMPAIRMENTS: decreased ROM, decreased strength, impaired UE functional use, postural dysfunction, and pain.   ACTIVITY LIMITATIONS: lifting, reach over head, and working at [employer]  PARTICIPATION LIMITATIONS: meal prep, cleaning, laundry, and occupation  PERSONAL FACTORS: Fitness and Time since onset of  injury/illness/exacerbation are also affecting patient's functional outcome.   REHAB POTENTIAL: Good  CLINICAL DECISION MAKING: Evolving/moderate complexity  EVALUATION COMPLEXITY: Moderate   GOALS: Goals reviewed with patient? Yes  SHORT TERM GOALS: Target date: 03/30/2024  Patient will be independent with initial HEP to improve outcomes and carryover.  Baseline: 100% PT assist required for HEP completion with correct form  Goal status: INITIAL  2.  Patient will report 25% improvement in L neck and shoulder pain to improve QOL.  Baseline: 7/10 at work Goal status: INITIAL   LONG TERM GOALS: Target date: 04/27/2024   Patient will be independent with ongoing/advanced HEP for self-management at home.  Baseline: no periscapular advanced HEP yet Goal status: INITIAL  2.  Patient will report 50-75% improvement in L neck and shoulder pain to improve QOL.  Baseline: 7/10 worst Goal status: INITIAL  3.  Patient to demonstrate improved upright posture with posterior shoulder girdle engaged to promote improved functional UE use. Baseline: forward head with all movements Goal status: INITIAL  4.  Patient to improve cervical AROM to 100% without pain provocation to allow for increased ease of ADLs.  Baseline: Refer to above UE ROM table Goal status: INITIAL  5.  Patient will demonstrate improved L shoulder strength to >/= 4+/5 for functional UE use. Baseline: Refer to above UE MMT table Goal status: INITIAL  6  Patient will report </= 50% on QuickDASH (MCID = 14%) overall more so to demonstrate improved functional ability.  Baseline: 31.8 Goal status: INITIAL  7.  Patient will be able to lift 5-20 lbs overhead repeatedly for 15-10 minutes to be able to work full shift at [employer] with minimal to no pain Baseline: pain after 1-2 hrs of work Goal Status:  INITIAL  PLAN:  PT FREQUENCY: 1-2x/week  PT DURATION: 8 weeks  PLANNED INTERVENTIONS: 97164- PT Re-evaluation, 97750-  Physical Performance Testing, 97110-Therapeutic exercises, 97530- Therapeutic activity, [id number]- Neuromuscular re-education, [id number]- Self Care, 02859- Manual therapy, G0283- Electrical stimulation (unattended), 97016- Vasopneumatic device, [id number]- Ultrasound, [id number]- Traction (mechanical), [id number]- Ionotophoresis 4mg /ml Dexamethasone , 79439 (1-2 muscles), 20561 (3+ muscles)- Dry Needling, Patient/Family education, Taping, Joint mobilization, Spinal mobilization, Cryotherapy, Moist heat, and Biofeedback  PLAN FOR NEXT SESSION: Reassess if traction had any lasting effects, Try KT taping, continue with shoulder and scapular strengthening  Sol LITTIE Gaskins, PTA 03/21/2024, 4:23 PM

## 2024-03-26 ENCOUNTER — Encounter: Admitting: Rehabilitation

## 2024-03-29 ENCOUNTER — Ambulatory Visit: Attending: Orthopedic Surgery

## 2024-03-29 DIAGNOSIS — G8929 Other chronic pain: Secondary | ICD-10-CM | POA: Insufficient documentation

## 2024-03-29 DIAGNOSIS — R2689 Other abnormalities of gait and mobility: Secondary | ICD-10-CM | POA: Insufficient documentation

## 2024-03-29 DIAGNOSIS — M62838 Other muscle spasm: Secondary | ICD-10-CM | POA: Insufficient documentation

## 2024-03-29 DIAGNOSIS — R293 Abnormal posture: Secondary | ICD-10-CM | POA: Insufficient documentation

## 2024-03-29 DIAGNOSIS — M6281 Muscle weakness (generalized): Secondary | ICD-10-CM | POA: Insufficient documentation

## 2024-03-29 DIAGNOSIS — M25512 Pain in left shoulder: Secondary | ICD-10-CM | POA: Insufficient documentation

## 2024-03-29 NOTE — Therapy (Signed)
 OUTPATIENT PHYSICAL THERAPY UPPER EXTREMITY TREATMENT   Patient Name: William Howe MRN: [id number] DOB:30-Jan-1991, 33 y.o., male Today's Date: 03/29/2024  END OF SESSION:  PT End of Session - 03/29/24 1615     Visit Number 19    Date for PT Re-Evaluation 04/27/24    Authorization Type [employer]    PT Start Time 1536    PT Stop Time 1617    PT Time Calculation (min) 41 min    Activity Tolerance Patient tolerated treatment well    Behavior During Therapy [facility] for tasks assessed/performed            History reviewed. No pertinent past medical history. Past Surgical History:  Procedure Laterality Date   HAND SURGERY     TOE SURGERY     There are no active problems to display for this patient.   PCP: Jason Leita Repine, FNP   REFERRING PROVIDER: Alexandra Lonni LABOR, *  REFERRING DIAG:  [phone] (ICD-10-CM) - Pain in left shoulder    THERAPY DIAG:  Chronic left shoulder pain  Muscle weakness (generalized)  Abnormal posture  Acute pain of left shoulder  RATIONALE FOR EVALUATION AND TREATMENT: Rehabilitation  ONSET DATE: 2/25  NEXT MD VISIT:     f/u PRN   SUBJECTIVE:                                                                                                                                                                                                         SUBJECTIVE STATEMENT:    Pt reports taking care of his mother this week, so his shoulder still hurts.   EVAL:  Pt. Is a 33 y/o male referred to PT from Dr Alexandra for L RTC tendonitis/ L shoulder pain.  PT has been doing PT here with us  for the last couple of months.  He was initially referred to us  from Dr Colon for L neck pain.    States Pain started for no apparent reason in 2/25.  States Went ER then to neurosurgery, had cervical MRI showing mild disc bulging.   Did well with PT here and had good results with his L upper trap/neck pain.   However, he is still having some L neck pain, but c/o  more of L shoulder pain.   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   States does a lot of heavy lifting and reaching when cooking which is hurting considerably.  He has a good HEP for neck stretching and has been  doing this.   He is frustrated that his symptoms have not resolved in 5 months.  saw Dr Alexandra who feels he has some RTC pathology and requested further PT To address this issue.    Pt. States he received a L shoulder injection but doesn't feel it helped.  States pain is intermittent but worst with work.   Sitting and resting can relieve the pain completely at times, but almost always has a dull achy pain in the L anterior shoulder.  He sttaes the pain is from the lateral greater tuberosity area to anteriorly into the pec area.  However, he still does have some L sided neck pain.    PAIN: Are you having pain? Yes: NPRS scale: 1/10 now;  worst 7/10 Pain location: L shoulder anteriorly Pain description: dull always, sharp with lifting and reaching forward Aggravating factors: lifting, repeated reaching Relieving factors: sitting and resting  PERTINENT HISTORY:  noncontributory  PRECAUTIONS: None  HAND DOMINANCE: Right  RED FLAGS: None  WEIGHT BEARING RESTRICTIONS: No  FALLS:  Has patient fallen in last 6 months? 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 Left arm weakness. No known injury.   EXAM: MRI CERVICAL SPINE  WITHOUT CONTRAST   TECHNIQUE: Multiplanar, multisequence MR imaging of the cervical spine was performed. No intravenous contrast was administered.   COMPARISON:  None Available.   FINDINGS: Alignment: Normal.   Vertebrae: No fracture, evidence of discitis, or bone lesion.   Cord: Normal signal throughout.   Posterior Fossa, vertebral arteries, paraspinal tissues: Negative.   Disc levels:   The central canal and foramina are widely patent at all levels. Disc height and hydration are maintained throughout. Minimal disc bulging C5-6 and C6-7 is noted.   IMPRESSION: Minimal disc bulging C5-6 and C6-7. The exam is otherwise negative. The central canal and foramina are widely patent at all levels.     Electronically Signed   By: Debby Prader M.D.   On: 12/06/2023 09:02  PATIENT SURVEYS:  Quick dash = 31.8  COGNITION: Overall cognitive status: Within functional limits for tasks assessed     SENSATION: WFL  POSTURE: Some rounded shoulder posture  CERVICAL ROM:   Active ROM A/PROM (deg) eval  Flexion 90  Extension 50  Right lateral flexion 75  Left lateral flexion 75  Right rotation 50  Left rotation 60   (Blank rows = not tested)   UPPER EXTREMITY ROM:   Active ROM Right eval Left eval  Shoulder flexion 170 170  Shoulder extension 50 50  Shoulder abduction 175 175  Shoulder adduction    Shoulder internal rotation To T10 T10  Shoulder external rotation C7 c7  Elbow flexion    Elbow extension    Wrist flexion    Wrist extension    Wrist ulnar deviation    Wrist radial deviation    Wrist pronation    Wrist supination    (Blank rows = not tested)  Active ROM Right eval Left eval  Thumb  MCP (0-60)    Thumb IP (0-80)    Thumb Radial abd/add (0-55)     Thumb Palmar abd/add (0-45)     Thumb Opposition to Small Finger     Index MCP (0-90)     Index PIP (0-100)     Index DIP (0-70)      Long MCP (0-90)      Long PIP (0-100)      Long DIP (0-70)       Ring MCP (0-90)      Ring PIP (0-100)      Ring DIP (0-70)      Little MCP (0-90)      Little PIP (0-100)      Little DIP (0-70)      (Blank rows = not tested)  UPPER EXTREMITY MMT:  MMT Right eval Left eval R 03/29/24 L 03/29/24  Shoulder flexion 4 4 5 5   Shoulder extension  4-    Shoulder abduction 4 4- 5 5  Shoulder adduction      Shoulder internal rotation 5 5 5 5   Shoulder external rotation 4+ 4 5 4+  Middle trapezius  3+ 4+ 4-  Lower trapezius  3+ 4+ 4-  Elbow flexion 5 5    Elbow extension 5 5    Wrist flexion 5 5    Wrist extension 5 5    Wrist ulnar deviation      Wrist radial deviation      Wrist pronation      Wrist supination      Grip strength (lbs) =BUE     (Blank rows = not tested)  SHOULDER SPECIAL TESTS: Impingement tests: Neer impingement test: positive , Hawkins/Kennedy impingement test: negative, and Painful arc test: negative SLAP lesions: Biceps load test: negative Instability tests: Apprehension test: negative and Sulcus sign: negative Rotator cuff assessment: Drop arm test: negative, Empty can test: positive , Full can test: positive , Gerber lift off test: negative, Infraspinatus test: negative, and Hornblower's sign: negative Biceps assessment: Yergason's test: negative and Speed's test: positive  A/C shear test is negative Scapular movement is difficult to assess due to large body habitus, but appears normal without winging, tipping Spurlings is negative to the R, but positive in neutral and to the L Cervical Distraction is negative  ULTT A and B are both negative LUE  JOINT MOBILITY TESTING:   Decreased posterior glide of the humerus, but otherwise feels hypermobile  PALPATION:  TTP over the L lateral cervical transverse processes and scalenes; L greater tuberosity around the anterior shoulder to subacromial space    TODAY'S TREATMENT:  03/29/24  UBE L2 3'F/3'B  THERAPEUTIC ACTIVITIES: To improve functional performance.  Demonstration,  verbal and tactile cues throughout for technique. Lower trap raises YTB 2 x 10  B ER with shoulder flexion YTB 2x10  NEUROMUSCULAR RE-EDUCATION: To improve posture.  Standing scapular clocks YTB x 10 BUE Standing L shoulder ER GTB x 20 Standing L shoulder IR GTB x 20 Standing single arm extension L GTB x 20 Standing single arm D1 extension GTB x 20 Seated lat pull down blue TB x 20  03/21/24 THERAPEUTIC EXERCISE: To improve strength.  Demonstration, verbal and tactile cues throughout for technique. UBE L2 3'F/3'B  NEUROMUSCULAR RE-EDUCATION: To improve posture.  Prone Shoulder Extension - Single Arm  2# x 2/10 LUE - Prone Shoulder Horizontal Abduction  2# x 2/10 LUE - Prone Single Arm Shoulder Y  2# x 2/10 LUE Sidelying L shoulder ER towel  at side 2x10 3LB       Wall ball CW/CCW 2x30 L UE Wall push up with pball x 10 Standing scapular clocks YTB x 5 BUE Manual Therapy:  KT tape for shoulder stability  03/14/24 THERAPEUTIC EXERCISE: To improve strength.  Demonstration, verbal and tactile cues throughout for technique. UBE L3 3'F/3'B  THERAPEUTIC ACTIVITIES: To improve functional performance.  Demonstration, verbal and tactile cues throughout for technique. Prone Shoulder Extension - Single Arm  1# x 3/10 LUE - Prone Shoulder Horizontal Abduction  1# x 3/10 LUE - Prone Single Arm Shoulder Y  1# x 3/10 LUE  Prone Shoulder Row  10# x 3/10 LUE - Prone Shoulder External Rotation at 90/90 1# x 3/10 LUE Seated Lat PD Blue TB x 20 LUE Counter push ups x 20 BUE  NEUROMUSCULAR RE-EDUCATION: To improve posture. Seated scapular clocks  Wall ball CW x 15; CCW x 15 LUE Wall clocks YTB LUE and RUE x 2 from 6:00 - 12:00  Intermittent Cervical traction 60 sec on/20 sec off @ 20lb pull x 15'  03/02/24 THERAPEUTIC EXERCISE: To improve strength.  Demonstration, verbal and tactile cues throughout for technique.   PATIENT EDUCATION:  Education details: PT eval findings, anticipated POC, and  initial HEP  Person educated: Patient Education method: Explanation, Demonstration, Verbal cues, Tactile cues, and MedBridgeGO app access provided Education comprehension: verbalized understanding, verbal cues required, tactile cues required, and needs further education  HOME EXERCISE PROGRAM: Access Code: [email or web address] URL: [email or web address] Date: 03/29/2024 Prepared by: Sharia Averitt  Exercises - Prone Shoulder Extension - Single Arm  - 1 x daily - 7 x weekly - 3 sets - 10 reps - Prone Shoulder Horizontal Abduction  - 1 x daily - 7 x weekly - 3 sets - 10 reps - Prone Single Arm Shoulder Y  - 1 x daily - 7 x weekly - 3 sets - 10 reps - Prone Shoulder Row  - 1 x daily - 7 x weekly - 2 sets - 10 reps - Prone Shoulder External Rotation  - 1 x daily - 7 x weekly - 3 sets - 10 reps - Cervical Retraction Prone on Elbows  - 1 x daily - 7 x weekly - 3 sets - 10 reps - Seated Scapular Clock (1 to 7)  - 1 x daily - 7 x weekly - 3 sets - 10 reps - Standing Wall [employer] with Mini Swiss Ball  - 1 x daily - 7 x weekly - 3 sets - 10 reps - Standing Low Trap Setting with Resistance at Wall  - 1 x daily - 7 x weekly - 3 sets - 10 reps - Wall Clock with Theraband  - 1 x daily - 7 x weekly - 3 sets - 10 reps   ASSESSMENT:  CLINICAL IMPRESSION: Advanced interventions focusing on scapular proprioception, posture, and shoulder stability. Pt showed god response to treatment. Cueing provided as needed to correct form and technique. Pt only noting mild improvement from using KT tape last time.  EVAL:  William Howe is a 33 y.o. male who was referred to physical therapy for evaluation and treatment for L RTC tendonitis/L shoulder pain   Patient reports onset of R shoulder pain beginning 2/25 for no reason. Pain is worse with any lifting or reaching forward to cook, etc.  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  He  has significantly weak periscapular musculature of 3+/5.    He still has L neck pain with positive Spurling's test as well as the positive RTC/impingement tests.   He is also quite hypermobile with passive ER/IR are well past 90 degrees.   Patient has deficits in cervical ROM, hyper flexibility, L shoulder strength, abnormal forward head posture, and TTP at L neck and shoulder which are interfering with ADLs and are impacting quality of life.  On QuickDASH patient scored 31.8/100 demonstrating 31.8% disability.  Spyros will benefit from skilled PT to address above deficits to improve mobility and activity tolerance with decreased pain interference.  OBJECTIVE IMPAIRMENTS: decreased ROM, decreased strength, impaired UE functional use, postural dysfunction, and pain.   ACTIVITY LIMITATIONS: lifting, reach over head, and working at [employer]  PARTICIPATION LIMITATIONS: meal prep, cleaning, laundry, and occupation  PERSONAL FACTORS: Fitness and Time since onset of injury/illness/exacerbation are also affecting patient's functional outcome.   REHAB POTENTIAL: Good  CLINICAL DECISION MAKING: Evolving/moderate complexity  EVALUATION COMPLEXITY: Moderate   GOALS: Goals reviewed with patient? Yes  SHORT TERM GOALS: Target date: 03/30/2024  Patient will be independent with initial HEP to improve outcomes and carryover.  Baseline: 100% PT assist required for HEP completion with correct form  Goal status: MET- 03/29/24  2.  Patient will report 25% improvement in L neck and shoulder pain to improve QOL.  Baseline: 7/10 at work Goal status: IN PROGRESS   LONG TERM GOALS: Target date: 04/27/2024   Patient will be independent with ongoing/advanced HEP for self-management at home.  Baseline: no periscapular advanced HEP yet Goal status: INITIAL  2.  Patient will report 50-75% improvement in L neck and shoulder pain to improve QOL.  Baseline: 7/10 worst Goal status: INITIAL  3.  Patient to  demonstrate improved upright posture with posterior shoulder girdle engaged to promote improved functional UE use. Baseline: forward head with all movements Goal status: INITIAL  4.  Patient to improve cervical AROM to 100% without pain provocation to allow for increased ease of ADLs.  Baseline: Refer to above UE ROM table Goal status: INITIAL  5.  Patient will demonstrate improved L shoulder strength to >/= 4+/5 for functional UE use. Baseline: Refer to above UE MMT table Goal status: IN PROGRESS- 03/29/24 see chart  6  Patient will report </= 50% on QuickDASH (MCID = 14%) overall more so to demonstrate improved functional ability.  Baseline: 31.8 Goal status: INITIAL  7.  Patient will be able to lift 5-20 lbs overhead repeatedly for 15-10 minutes to be able to work full shift at [employer] with minimal to no pain Baseline: pain after 1-2 hrs of work Goal Status:  INITIAL  PLAN:  PT FREQUENCY: 1-2x/week  PT DURATION: 8 weeks  PLANNED INTERVENTIONS: 97164- PT Re-evaluation, 97750- Physical Performance Testing, 97110-Therapeutic exercises, 97530- Therapeutic activity, [id number]- Neuromuscular re-education, [id number]- Self Care, 02859- Manual therapy, [id number]- Electrical stimulation (unattended), 97016- Vasopneumatic device, [id number]- Ultrasound, [id number]- Traction (mechanical), [id number]- Ionotophoresis 4mg /ml Dexamethasone , 79439 (1-2 muscles), 20561 (3+ muscles)- Dry Needling, Patient/Family education, Taping, Joint mobilization, Spinal mobilization, Cryotherapy, Moist heat, and Biofeedback  PLAN FOR NEXT SESSION: continue with shoulder and scapular strengthening; try some self posterior humeral glides?  Kelyn Ponciano L Yuuki Skeens, PTA 03/29/2024, 4:17 PM

## 2024-04-02 ENCOUNTER — Ambulatory Visit

## 2024-04-02 DIAGNOSIS — M25512 Pain in left shoulder: Secondary | ICD-10-CM

## 2024-04-02 DIAGNOSIS — G8929 Other chronic pain: Secondary | ICD-10-CM

## 2024-04-02 DIAGNOSIS — M6281 Muscle weakness (generalized): Secondary | ICD-10-CM

## 2024-04-02 DIAGNOSIS — R293 Abnormal posture: Secondary | ICD-10-CM

## 2024-04-02 NOTE — Therapy (Addendum)
 OUTPATIENT PHYSICAL THERAPY UPPER EXTREMITY TREATMENT   Patient Name: William Howe MRN: [id number] DOB:1990/11/25, 33 y.o., male Today's Date: 04/02/2024  END OF SESSION:  PT End of Session - 04/02/24 1532     Visit Number 20    Date for PT Re-Evaluation 04/27/24    Authorization Type [employer]    PT Start Time 1442    PT Stop Time 1530    PT Time Calculation (min) 48 min    Activity Tolerance Patient tolerated treatment well    Behavior During Therapy [facility] for tasks assessed/performed             History reviewed. No pertinent past medical history. Past Surgical History:  Procedure Laterality Date   HAND SURGERY     TOE SURGERY     There are no active problems to display for this patient.   PCP: Jason Leita Repine, FNP   REFERRING PROVIDER: Alexandra Lonni LABOR, *  REFERRING DIAG:  [phone] (ICD-10-CM) - Pain in left shoulder    THERAPY DIAG:  Chronic left shoulder pain  Muscle weakness (generalized)  Abnormal posture  Acute pain of left shoulder  RATIONALE FOR EVALUATION AND TREATMENT: Rehabilitation  ONSET DATE: 2/25  NEXT MD VISIT:     f/u PRN   SUBJECTIVE:                                                                                                                                                                                                         SUBJECTIVE STATEMENT:    Shoulder doing about the same, pt he will call orthopedist to see about getting an MRI.   EVAL:  Pt. Is a 33 y/o male referred to PT from Dr Alexandra for L RTC tendonitis/ L shoulder pain.  PT has been doing PT here with us  for the last couple of months.  He was initially referred to us  from Dr Colon for L neck pain.    States Pain started for no apparent reason in 2/25.  States Went ER then to neurosurgery, had cervical MRI showing mild disc bulging.   Did well with PT here and had good results with his L upper trap/neck pain.   However, he is still having some L neck  pain, but c/o more of L shoulder pain.   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   States does a lot of heavy lifting and reaching when cooking which is hurting considerably.  He has a good HEP for neck stretching  and has been doing this.   He is frustrated that his symptoms have not resolved in 5 months.  saw Dr Alexandra who feels he has some RTC pathology and requested further PT To address this issue.    Pt. States he received a L shoulder injection but doesn't feel it helped.  States pain is intermittent but worst with work.   Sitting and resting can relieve the pain completely at times, but almost always has a dull achy pain in the L anterior shoulder.  He sttaes the pain is from the lateral greater tuberosity area to anteriorly into the pec area.  However, he still does have some L sided neck pain.    PAIN: Are you having pain? Yes: NPRS scale: 1/10 now;  worst 7/10 Pain location: L shoulder anteriorly Pain description: dull always, sharp with lifting and reaching forward Aggravating factors: lifting, repeated reaching Relieving factors: sitting and resting  PERTINENT HISTORY:  noncontributory  PRECAUTIONS: None  HAND DOMINANCE: Right  RED FLAGS: None  WEIGHT BEARING RESTRICTIONS: No  FALLS:  Has patient fallen in last 6 months? 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 Left arm weakness. No known injury.   EXAM: MRI  CERVICAL SPINE WITHOUT CONTRAST   TECHNIQUE: Multiplanar, multisequence MR imaging of the cervical spine was performed. No intravenous contrast was administered.   COMPARISON:  None Available.   FINDINGS: Alignment: Normal.   Vertebrae: No fracture, evidence of discitis, or bone lesion.   Cord: Normal signal throughout.   Posterior Fossa, vertebral arteries, paraspinal tissues: Negative.   Disc levels:   The central canal and foramina are widely patent at all levels. Disc height and hydration are maintained throughout. Minimal disc bulging C5-6 and C6-7 is noted.   IMPRESSION: Minimal disc bulging C5-6 and C6-7. The exam is otherwise negative. The central canal and foramina are widely patent at all levels.     Electronically Signed   By: Debby Prader M.D.   On: 12/06/2023 09:02  PATIENT SURVEYS:  Quick dash = 31.8  COGNITION: Overall cognitive status: Within functional limits for tasks assessed     SENSATION: WFL  POSTURE: Some rounded shoulder posture  CERVICAL ROM:   Active ROM A/PROM (deg) eval  Flexion 90  Extension 50  Right lateral flexion 75  Left lateral flexion 75  Right rotation 50  Left rotation 60   (Blank rows = not tested)   UPPER EXTREMITY ROM:   Active ROM Right eval Left eval  Shoulder flexion 170 170  Shoulder extension 50 50  Shoulder abduction 175 175  Shoulder adduction    Shoulder internal rotation To T10 T10  Shoulder external rotation C7 c7  Elbow flexion    Elbow extension    Wrist flexion    Wrist extension    Wrist ulnar deviation    Wrist radial deviation    Wrist pronation    Wrist supination    (Blank rows = not tested)  Active ROM Right eval Left  eval  Thumb MCP (0-60)    Thumb IP (0-80)    Thumb Radial abd/add (0-55)     Thumb Palmar abd/add (0-45)     Thumb Opposition to Small Finger     Index MCP (0-90)     Index PIP (0-100)     Index DIP (0-70)      Long MCP (0-90)      Long PIP (0-100)       Long DIP (0-70)      Ring MCP (0-90)      Ring PIP (0-100)      Ring DIP (0-70)      Little MCP (0-90)      Little PIP (0-100)      Little DIP (0-70)      (Blank rows = not tested)  UPPER EXTREMITY MMT:  MMT Right eval Left eval R 03/29/24 L 03/29/24  Shoulder flexion 4 4 5 5   Shoulder extension  4-    Shoulder abduction 4 4- 5 5  Shoulder adduction      Shoulder internal rotation 5 5 5 5   Shoulder external rotation 4+ 4 5 4+  Middle trapezius  3+ 4+ 4-  Lower trapezius  3+ 4+ 4-  Elbow flexion 5 5    Elbow extension 5 5    Wrist flexion 5 5    Wrist extension 5 5    Wrist ulnar deviation      Wrist radial deviation      Wrist pronation      Wrist supination      Grip strength (lbs) =BUE     (Blank rows = not tested)  SHOULDER SPECIAL TESTS: Impingement tests: Neer impingement test: positive , Hawkins/Kennedy impingement test: negative, and Painful arc test: negative SLAP lesions: Biceps load test: negative Instability tests: Apprehension test: negative and Sulcus sign: negative Rotator cuff assessment: Drop arm test: negative, Empty can test: positive , Full can test: positive , Gerber lift off test: negative, Infraspinatus test: negative, and Hornblower's sign: negative Biceps assessment: Yergason's test: negative and Speed's test: positive  A/C shear test is negative Scapular movement is difficult to assess due to large body habitus, but appears normal without winging, tipping Spurlings is negative to the R, but positive in neutral and to the L Cervical Distraction is negative  ULTT A and B are both negative LUE  JOINT MOBILITY TESTING:   Decreased posterior glide of the humerus, but otherwise feels hypermobile  PALPATION:  TTP over the L lateral cervical transverse processes and scalenes; L greater tuberosity around the anterior shoulder to subacromial space    TODAY'S TREATMENT:  04/02/24  UBE L3 3'F/3'B Chest stretch in doorway x 1' THERAPEUTIC ACTIVITIES:   Seated rows 35lb 2x10 mid rows Seated lat pulls 35lb 2x10 BLE  NEUROMUSCULAR RE-EDUCATION: To improve posture. Standing posterior humeral glides GTB w/ shoulder flexion, abd, ER at 90 deg S/L L shoulder abduction 3lb 2x10  S/L L shoulder horiz ABD 3lb 2x10  S/L L shoulder ER 3lb 2x10  Wall push ups w/ pball x 20  03/29/24  UBE L2 3'F/3'B  THERAPEUTIC ACTIVITIES: To improve functional performance.  Demonstration, verbal and tactile cues throughout for technique. Lower trap raises YTB 2 x 10  B ER with shoulder flexion YTB 2x10  NEUROMUSCULAR RE-EDUCATION: To improve posture.  Standing scapular clocks YTB x 10 BUE Standing L shoulder ER GTB x 20 Standing L shoulder IR GTB x 20 Standing single arm extension L GTB x 20 Standing  single arm D1 extension GTB x 20 Seated lat pull down blue TB x 20  03/21/24 THERAPEUTIC EXERCISE: To improve strength.  Demonstration, verbal and tactile cues throughout for technique. UBE L2 3'F/3'B  NEUROMUSCULAR RE-EDUCATION: To improve posture.  Prone Shoulder Extension - Single Arm  2# x 2/10 LUE - Prone Shoulder Horizontal Abduction  2# x 2/10 LUE - Prone Single Arm Shoulder Y  2# x 2/10 LUE Sidelying L shoulder ER towel at side 2x10 3LB       Wall ball CW/CCW 2x30 L UE Wall push up with pball x 10 Standing scapular clocks YTB x 5 BUE Manual Therapy:  KT tape for shoulder stability  03/14/24 THERAPEUTIC EXERCISE: To improve strength.  Demonstration, verbal and tactile cues throughout for technique. UBE L3 3'F/3'B  THERAPEUTIC ACTIVITIES: To improve functional performance.  Demonstration, verbal and tactile cues throughout for technique. Prone Shoulder Extension - Single Arm  1# x 3/10 LUE - Prone Shoulder Horizontal Abduction  1# x 3/10 LUE - Prone Single Arm Shoulder Y  1# x 3/10 LUE  Prone Shoulder Row  10# x 3/10 LUE - Prone Shoulder External Rotation at 90/90 1# x 3/10 LUE Seated Lat PD Blue TB x 20 LUE Counter push ups x 20  BUE  NEUROMUSCULAR RE-EDUCATION: To improve posture. Seated scapular clocks  Wall ball CW x 15; CCW x 15 LUE Wall clocks YTB LUE and RUE x 2 from 6:00 - 12:00  Intermittent Cervical traction 60 sec on/20 sec off @ 20lb pull x 15'  03/02/24 THERAPEUTIC EXERCISE: To improve strength.  Demonstration, verbal and tactile cues throughout for technique.   PATIENT EDUCATION:  Education details: PT eval findings, anticipated POC, and initial HEP  Person educated: Patient Education method: Explanation, Demonstration, Verbal cues, Tactile cues, and MedBridgeGO app access provided Education comprehension: verbalized understanding, verbal cues required, tactile cues required, and needs further education  HOME EXERCISE PROGRAM: Access Code: [email or web address] URL: [email or web address] Date: 03/29/2024 Prepared by: Sol Gaskins  Exercises - Prone Shoulder Extension - Single Arm  - 1 x daily - 7 x weekly - 3 sets - 10 reps - Prone Shoulder Horizontal Abduction  - 1 x daily - 7 x weekly - 3 sets - 10 reps - Prone Single Arm Shoulder Y  - 1 x daily - 7 x weekly - 3 sets - 10 reps - Prone Shoulder Row  - 1 x daily - 7 x weekly - 2 sets - 10 reps - Prone Shoulder External Rotation  - 1 x daily - 7 x weekly - 3 sets - 10 reps - Cervical Retraction Prone on Elbows  - 1 x daily - 7 x weekly - 3 sets - 10 reps - Seated Scapular Clock (1 to 7)  - 1 x daily - 7 x weekly - 3 sets - 10 reps - Standing Wall [employer] with Mini Swiss Ball  - 1 x daily - 7 x weekly - 3 sets - 10 reps - Standing Low Trap Setting with Resistance at Wall  - 1 x daily - 7 x weekly - 3 sets - 10 reps - Wall Clock with Theraband  - 1 x daily - 7 x weekly - 3 sets - 10 reps   ASSESSMENT:  CLINICAL IMPRESSION: Pt continues to report pain with work related activities. Progressed with posterior shoulder strengthening and stabilization. Provided education on using pain patches to use while working or using ice after shift for  pain management. Pt will call to  schedule visit with Ortho to see about getting an MRI, in mean time will continue with PT.   EVAL:  William Howe is a 33 y.o. male who was referred to physical therapy for evaluation and treatment for L RTC tendonitis/L shoulder pain   Patient reports onset of R shoulder pain beginning 2/25 for no reason. Pain is worse with any lifting or reaching forward to cook, etc.  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  He has significantly weak periscapular musculature of 3+/5.    He still has L neck pain with positive Spurling's test as well as the positive RTC/impingement tests.   He is also quite hypermobile with passive ER/IR are well past 90 degrees.   Patient has deficits in cervical ROM, hyper flexibility, L shoulder strength, abnormal forward head posture, and TTP at L neck and shoulder which are interfering with ADLs and are impacting quality of life.  On QuickDASH patient scored 31.8/100 demonstrating 31.8% disability.  Stylianos will benefit from skilled PT to address above deficits to improve mobility and activity tolerance with decreased pain interference.  OBJECTIVE IMPAIRMENTS: decreased ROM, decreased strength, impaired UE functional use, postural dysfunction, and pain.   ACTIVITY LIMITATIONS: lifting, reach over head, and working at [employer]  PARTICIPATION LIMITATIONS: meal prep, cleaning, laundry, and occupation  PERSONAL FACTORS: Fitness and Time since onset of injury/illness/exacerbation are also affecting patient's functional outcome.   REHAB POTENTIAL: Good  CLINICAL DECISION MAKING: Evolving/moderate complexity  EVALUATION COMPLEXITY: Moderate   GOALS: Goals reviewed with patient? Yes  SHORT TERM GOALS: Target date: 03/30/2024  Patient will be independent with initial HEP to improve outcomes and carryover.  Baseline: 100% PT assist required for HEP completion with  correct form  Goal status: MET- 03/29/24  2.  Patient will report 25% improvement in L neck and shoulder pain to improve QOL.  Baseline: 7/10 at work Goal status: IN PROGRESS-    LONG TERM GOALS: Target date: 04/27/2024   Patient will be independent with ongoing/advanced HEP for self-management at home.  Baseline: no periscapular advanced HEP yet Goal status: INITIAL  2.  Patient will report 50-75% improvement in L neck and shoulder pain to improve QOL.  Baseline: 7/10 worst Goal status: INITIAL  3.  Patient to demonstrate improved upright posture with posterior shoulder girdle engaged to promote improved functional UE use. Baseline: forward head with all movements Goal status: INITIAL  4.  Patient to improve cervical AROM to 100% without pain provocation to allow for increased ease of ADLs.  Baseline: Refer to above UE ROM table Goal status: INITIAL  5.  Patient will demonstrate improved L shoulder strength to >/= 4+/5 for functional UE use. Baseline: Refer to above UE MMT table Goal status: IN PROGRESS- 03/29/24 see chart  6  Patient will report </= 50% on QuickDASH (MCID = 14%) overall more so to demonstrate improved functional ability.  Baseline: 31.8 Goal status: INITIAL  7.  Patient will be able to lift 5-20 lbs overhead repeatedly for 15-10 minutes to be able to work full shift at [employer] with minimal to no pain Baseline: pain after 1-2 hrs of work Goal Status:  INITIAL  PLAN:  PT FREQUENCY: 1-2x/week  PT DURATION: 8 weeks  PLANNED INTERVENTIONS: 97164- PT Re-evaluation, 97750- Physical Performance Testing, 97110-Therapeutic exercises, 97530- Therapeutic activity, [id number]- Neuromuscular re-education, [id number]- Self Care, 02859- Manual therapy, H9716- Electrical stimulation (unattended), 97016-  Vasopneumatic device, [id number]- Ultrasound, 02987- Traction (mechanical), [id number]- Ionotophoresis 4mg /ml Dexamethasone , 20560 (1-2 muscles), 20561 (3+ muscles)- Dry Needling, Patient/Family  education, Taping, Joint mobilization, Spinal mobilization, Cryotherapy, Moist heat, and Biofeedback  PLAN FOR NEXT SESSION: continue with shoulder and scapular strengthening; try some self posterior humeral glides?  Julianah Marciel L Clarine Elrod, PTA 04/02/2024, 3:32 PM

## 2024-04-04 NOTE — Telephone Encounter (Signed)
 Called patient on 04/04/2024 at 10:32am.  No answer from patient and LVM.  Message stated that Dr. Alexandra would like to move forward with an MRI of his left shoulder at this time to r/o RTC tear.  Patient instructed to call back so we can place referral to closest facility and ask safety questions.

## 2024-04-04 NOTE — Telephone Encounter (Signed)
 Pt is returning call  Pt can be reached at [phone]

## 2024-04-05 ENCOUNTER — Ambulatory Visit

## 2024-04-05 DIAGNOSIS — M62838 Other muscle spasm: Secondary | ICD-10-CM

## 2024-04-05 DIAGNOSIS — M25512 Pain in left shoulder: Secondary | ICD-10-CM | POA: Diagnosis not present

## 2024-04-05 DIAGNOSIS — M6281 Muscle weakness (generalized): Secondary | ICD-10-CM

## 2024-04-05 DIAGNOSIS — R2689 Other abnormalities of gait and mobility: Secondary | ICD-10-CM

## 2024-04-05 DIAGNOSIS — G8929 Other chronic pain: Secondary | ICD-10-CM

## 2024-04-05 DIAGNOSIS — R293 Abnormal posture: Secondary | ICD-10-CM

## 2024-04-05 NOTE — Therapy (Signed)
 OUTPATIENT PHYSICAL THERAPY UPPER EXTREMITY TREATMENT   Patient Name: William Howe MRN: [id number] DOB:September 01, 1990, 33 y.o., male Today's Date: 04/05/2024  END OF SESSION:  PT End of Session - 04/05/24 1443     Visit Number 21    Date for PT Re-Evaluation 04/27/24    Authorization Type [employer]    PT Start Time 1440    PT Stop Time 1535    PT Time Calculation (min) 55 min    Activity Tolerance Patient tolerated treatment well    Behavior During Therapy [facility] for tasks assessed/performed              History reviewed. No pertinent past medical history. Past Surgical History:  Procedure Laterality Date   HAND SURGERY     TOE SURGERY     There are no active problems to display for this patient.   PCP: Jason Leita Repine, FNP   REFERRING PROVIDER: Alexandra Lonni LABOR, *  REFERRING DIAG:  [phone] (ICD-10-CM) - Pain in left shoulder    THERAPY DIAG:  Chronic left shoulder pain  Muscle weakness (generalized)  Abnormal posture  Acute pain of left shoulder  Other muscle spasm  Other abnormalities of gait and mobility  RATIONALE FOR EVALUATION AND TREATMENT: Rehabilitation  ONSET DATE: 2/25  NEXT MD VISIT:     f/u PRN   SUBJECTIVE:                                                                                                                                                                                                         SUBJECTIVE STATEMENT:    Still waiting to hear from doctors office to schedule MRI. Pain is a little worse today as he worked yesterday.   EVAL:  Pt. Is a 33 y/o male referred to PT from Dr Alexandra for L RTC tendonitis/ L shoulder pain.  PT has been doing PT here with us  for the last couple of months.  He was initially referred to us  from Dr Colon for L neck pain.    States Pain started for no apparent reason in 2/25.  States Went ER then to neurosurgery, had cervical MRI showing mild disc bulging.   Did well with PT here and  had good results with his L upper trap/neck pain.   However, he is still having some L neck pain, but c/o more of L shoulder pain.   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   States does a lot of heavy lifting and  reaching when cooking which is hurting considerably.  He has a good HEP for neck stretching and has been doing this.   He is frustrated that his symptoms have not resolved in 5 months.  saw Dr Alexandra who feels he has some RTC pathology and requested further PT To address this issue.    Pt. States he received a L shoulder injection but doesn't feel it helped.  States pain is intermittent but worst with work.   Sitting and resting can relieve the pain completely at times, but almost always has a dull achy pain in the L anterior shoulder.  He sttaes the pain is from the lateral greater tuberosity area to anteriorly into the pec area.  However, he still does have some L sided neck pain.    PAIN: Are you having pain? Yes: NPRS scale: 1/10 now;  worst 7/10 Pain location: L shoulder anteriorly Pain description: dull always, sharp with lifting and reaching forward Aggravating factors: lifting, repeated reaching Relieving factors: sitting and resting  PERTINENT HISTORY:  noncontributory  PRECAUTIONS: None  HAND DOMINANCE: Right  RED FLAGS: None  WEIGHT BEARING RESTRICTIONS: No  FALLS:  Has patient fallen in last 6 months? 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 Left arm weakness. No known injury.   EXAM: MRI CERVICAL SPINE WITHOUT CONTRAST   TECHNIQUE: Multiplanar, multisequence MR imaging of the cervical spine was performed. No intravenous contrast was administered.   COMPARISON:  None Available.   FINDINGS: Alignment: Normal.   Vertebrae: No fracture, evidence of discitis, or bone lesion.   Cord: Normal signal throughout.   Posterior Fossa, vertebral arteries, paraspinal tissues: Negative.   Disc levels:   The central canal and foramina are widely patent at all levels. Disc height and hydration are maintained throughout. Minimal disc bulging C5-6 and C6-7 is noted.   IMPRESSION: Minimal disc bulging C5-6 and C6-7. The exam is otherwise negative. The central canal and foramina are widely patent at all levels.     Electronically Signed   By: Debby Prader M.D.   On: 12/06/2023 09:02  PATIENT SURVEYS:  Quick dash = 31.8  COGNITION: Overall cognitive status: Within functional limits for tasks assessed     SENSATION: WFL  POSTURE: Some rounded shoulder posture  CERVICAL ROM:   Active ROM A/PROM (deg) eval 04/05/24  Flexion 90 full  Extension 50 full  Right lateral flexion 75 full  Left lateral flexion 75 full  Right rotation 50 full  Left rotation 60 full   (Blank rows = not tested)   UPPER EXTREMITY ROM:   Active ROM Right eval Left eval L 04/05/24  Shoulder flexion 170 170 150 standing 165 supine  Shoulder extension 50 50   Shoulder abduction 175 175 145 standing 164- supine  Shoulder adduction     Shoulder internal rotation To T10 T10 T7  Shoulder external rotation C7 c7 T3  Elbow flexion     Elbow extension     Wrist flexion  Wrist extension     Wrist ulnar deviation     Wrist radial deviation     Wrist pronation     Wrist supination     (Blank rows = not tested)  Active ROM Right eval Left eval  Thumb MCP (0-60)    Thumb IP (0-80)    Thumb  Radial abd/add (0-55)     Thumb Palmar abd/add (0-45)     Thumb Opposition to Small Finger     Index MCP (0-90)     Index PIP (0-100)     Index DIP (0-70)      Long MCP (0-90)      Long PIP (0-100)      Long DIP (0-70)      Ring MCP (0-90)      Ring PIP (0-100)      Ring DIP (0-70)      Little MCP (0-90)      Little PIP (0-100)      Little DIP (0-70)      (Blank rows = not tested)  UPPER EXTREMITY MMT:  MMT Right eval Left eval R 03/29/24 L 03/29/24  Shoulder flexion 4 4 5 5   Shoulder extension  4-    Shoulder abduction 4 4- 5 5  Shoulder adduction      Shoulder internal rotation 5 5 5 5   Shoulder external rotation 4+ 4 5 4+  Middle trapezius  3+ 4+ 4-  Lower trapezius  3+ 4+ 4-  Elbow flexion 5 5    Elbow extension 5 5    Wrist flexion 5 5    Wrist extension 5 5    Wrist ulnar deviation      Wrist radial deviation      Wrist pronation      Wrist supination      Grip strength (lbs) =BUE     (Blank rows = not tested)  SHOULDER SPECIAL TESTS: Impingement tests: Neer impingement test: positive , Hawkins/Kennedy impingement test: negative, and Painful arc test: negative SLAP lesions: Biceps load test: negative Instability tests: Apprehension test: negative and Sulcus sign: negative Rotator cuff assessment: Drop arm test: negative, Empty can test: positive , Full can test: positive , Gerber lift off test: negative, Infraspinatus test: negative, and Hornblower's sign: negative Biceps assessment: Yergason's test: negative and Speed's test: positive  A/C shear test is negative Scapular movement is difficult to assess due to large body habitus, but appears normal without winging, tipping Spurlings is negative to the R, but positive in neutral and to the L Cervical Distraction is negative  ULTT A and B are both negative LUE  JOINT MOBILITY TESTING:   Decreased posterior glide of the humerus, but otherwise feels hypermobile  PALPATION:  TTP over the L lateral cervical  transverse processes and scalenes; L greater tuberosity around the anterior shoulder to subacromial space    TODAY'S TREATMENT:  04/05/24  UBE L4 3'F/3'B Chest stretch in doorway x 1'; 2 reps THERAPEUTIC ACTIVITIES:  Box lifts 20lb 2x10   NEUROMUSCULAR RE-EDUCATION: To improve posture. Standing scapular clocks RTB x 10 BUE Lower trap raises RTB  x 10 Low horiz ABD RTB 5x5 Standing B ER RTB 2x10 + scap retraction Standing B horiz ABD RTB 2x10 + scap retraction Seated thoracic ext in chair 5x5  MANUAL THERAPY: To promote normalized muscle tension, improve joint mobility and/or for pain modulation  L shoulder posterior glides to improve ROM grade 3-4\ KT tape for RTC: I strip from coracoid, along supraspinatus muscle  to medial border of scapula I strip from middle deltoid to mid medial border of scapula 04/02/24  UBE L3 3'F/3'B Chest stretch in doorway x 1' THERAPEUTIC ACTIVITIES:  Seated rows 35lb 2x10 mid rows Seated lat pulls 35lb 2x10 BLE  NEUROMUSCULAR RE-EDUCATION: To improve posture. Standing posterior humeral glides GTB w/ shoulder flexion, abd, ER at 90 deg S/L L shoulder abduction 3lb 2x10  S/L L shoulder horiz ABD 3lb 2x10  S/L L shoulder ER 3lb 2x10  Wall push ups w/ pball x 20  03/29/24  UBE L2 3'F/3'B  THERAPEUTIC ACTIVITIES: To improve functional performance.  Demonstration, verbal and tactile cues throughout for technique. Lower trap raises YTB 2 x 10  B ER with shoulder flexion YTB 2x10  NEUROMUSCULAR RE-EDUCATION: To improve posture.  Standing scapular clocks YTB x 10 BUE Standing L shoulder ER GTB x 20 Standing L shoulder IR GTB x 20 Standing single arm extension L GTB x 20 Standing single arm D1 extension GTB x 20 Seated lat pull down blue TB x 20  03/21/24 THERAPEUTIC EXERCISE: To improve strength.  Demonstration, verbal and tactile cues throughout for technique. UBE L2 3'F/3'B  NEUROMUSCULAR RE-EDUCATION: To improve posture.  Prone Shoulder  Extension - Single Arm  2# x 2/10 LUE - Prone Shoulder Horizontal Abduction  2# x 2/10 LUE - Prone Single Arm Shoulder Y  2# x 2/10 LUE Sidelying L shoulder ER towel at side 2x10 3LB       Wall ball CW/CCW 2x30 L UE Wall push up with pball x 10 Standing scapular clocks YTB x 5 BUE Manual Therapy:  KT tape for shoulder stability  03/14/24 THERAPEUTIC EXERCISE: To improve strength.  Demonstration, verbal and tactile cues throughout for technique. UBE L3 3'F/3'B  THERAPEUTIC ACTIVITIES: To improve functional performance.  Demonstration, verbal and tactile cues throughout for technique. Prone Shoulder Extension - Single Arm  1# x 3/10 LUE - Prone Shoulder Horizontal Abduction  1# x 3/10 LUE - Prone Single Arm Shoulder Y  1# x 3/10 LUE  Prone Shoulder Row  10# x 3/10 LUE - Prone Shoulder External Rotation at 90/90 1# x 3/10 LUE Seated Lat PD Blue TB x 20 LUE Counter push ups x 20 BUE  NEUROMUSCULAR RE-EDUCATION: To improve posture. Seated scapular clocks  Wall ball CW x 15; CCW x 15 LUE Wall clocks YTB LUE and RUE x 2 from 6:00 - 12:00  Intermittent Cervical traction 60 sec on/20 sec off @ 20lb pull x 15'  03/02/24 THERAPEUTIC EXERCISE: To improve strength.  Demonstration, verbal and tactile cues throughout for technique.   PATIENT EDUCATION:  Education details: PT eval findings, anticipated POC, and initial HEP  Person educated: Patient Education method: Explanation, Demonstration, Verbal cues, Tactile cues, and MedBridgeGO app access provided Education comprehension: verbalized understanding, verbal cues required, tactile cues required, and needs further education  HOME EXERCISE PROGRAM: Access Code: [email or web address] URL: [email or web address] Date: 03/29/2024 Prepared by: Arrionna Serena  Exercises - Prone Shoulder Extension - Single Arm  - 1 x daily - 7 x weekly - 3 sets - 10 reps - Prone Shoulder Horizontal Abduction  - 1 x daily - 7 x weekly - 3 sets - 10 reps -  Prone Single Arm Shoulder Y  - 1 x daily - 7 x weekly - 3 sets - 10 reps - Prone Shoulder Row  - 1 x daily - 7 x weekly - 2 sets - 10 reps - Prone Shoulder External Rotation  - 1 x daily -  7 x weekly - 3 sets - 10 reps - Cervical Retraction Prone on Elbows  - 1 x daily - 7 x weekly - 3 sets - 10 reps - Seated Scapular Clock (1 to 7)  - 1 x daily - 7 x weekly - 3 sets - 10 reps - Standing Wall [employer] with Mini Swiss Ball  - 1 x daily - 7 x weekly - 3 sets - 10 reps - Standing Low Trap Setting with Resistance at Wall  - 1 x daily - 7 x weekly - 3 sets - 10 reps - Wall Clock with Theraband  - 1 x daily - 7 x weekly - 3 sets - 10 reps   ASSESSMENT:  CLINICAL IMPRESSION: Pt progressing well with goals. Shows full cerv ROM on assessment. Shoulder ROM was slightly less with OH movements. Some pain today with periscapular stabilization, address with joint mobs (really liked posterior glides). KT tape applied for RTC support and offloading, especially for work tomorrow.   EVAL:  William Howe is a 33 y.o. male who was referred to physical therapy for evaluation and treatment for L RTC tendonitis/L shoulder pain   Patient reports onset of R shoulder pain beginning 2/25 for no reason. Pain is worse with any lifting or reaching forward to cook, etc.  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  He has significantly weak periscapular musculature of 3+/5.    He still has L neck pain with positive Spurling's test as well as the positive RTC/impingement tests.   He is also quite hypermobile with passive ER/IR are well past 90 degrees.   Patient has deficits in cervical ROM, hyper flexibility, L shoulder strength, abnormal forward head posture, and TTP at L neck and shoulder which are interfering with ADLs and are impacting quality of life.  On QuickDASH patient scored 31.8/100 demonstrating 31.8% disability.  William Howe will benefit from  skilled PT to address above deficits to improve mobility and activity tolerance with decreased pain interference.  OBJECTIVE IMPAIRMENTS: decreased ROM, decreased strength, impaired UE functional use, postural dysfunction, and pain.   ACTIVITY LIMITATIONS: lifting, reach over head, and working at [employer]  PARTICIPATION LIMITATIONS: meal prep, cleaning, laundry, and occupation  PERSONAL FACTORS: Fitness and Time since onset of injury/illness/exacerbation are also affecting patient's functional outcome.   REHAB POTENTIAL: Good  CLINICAL DECISION MAKING: Evolving/moderate complexity  EVALUATION COMPLEXITY: Moderate   GOALS: Goals reviewed with patient? Yes  SHORT TERM GOALS: Target date: 03/30/2024  Patient will be independent with initial HEP to improve outcomes and carryover.  Baseline: 100% PT assist required for HEP completion with correct form  Goal status: MET- 03/29/24  2.  Patient will report 25% improvement in L neck and shoulder pain to improve QOL.  Baseline: 7/10 at work Goal status: IN PROGRESS-    LONG TERM GOALS: Target date: 04/27/2024   Patient will be independent with ongoing/advanced HEP for self-management at home.  Baseline: no periscapular advanced HEP yet Goal status: INITIAL  2.  Patient will report 50-75% improvement in L neck and shoulder pain to improve QOL.  Baseline: 7/10 worst Goal status: INITIAL  3.  Patient to demonstrate improved upright posture with posterior shoulder girdle engaged to promote improved functional UE use. Baseline: forward head with all movements Goal status: INITIAL  4.  Patient to improve cervical AROM to 100% without pain provocation to allow for  increased ease of ADLs.  Baseline: Refer to above UE ROM table Goal status: MET- 04/05/24  5.  Patient will demonstrate improved L shoulder strength to >/= 4+/5 for functional UE use. Baseline: Refer to above UE MMT table Goal status: IN PROGRESS- 03/29/24 see chart  6  Patient  will report </= 50% on QuickDASH (MCID = 14%) overall more so to demonstrate improved functional ability.  Baseline: 31.8 Goal status: INITIAL  7.  Patient will be able to lift 5-20 lbs overhead repeatedly for 15-10 minutes to be able to work full shift at [employer] with minimal to no pain Baseline: pain after 1-2 hrs of work Goal Status:  04/05/24- still pain at work  PLAN:  PT FREQUENCY: 1-2x/week  PT DURATION: 8 weeks  PLANNED INTERVENTIONS: 97164- PT Re-evaluation, 97750- Physical Performance Testing, 97110-Therapeutic exercises, 97530- Therapeutic activity, [id number]- Neuromuscular re-education, 97535- Self Care, 02859- Manual therapy, [id number]- Electrical stimulation (unattended), 97016- Vasopneumatic device, [id number]- Ultrasound, [id number]- Traction (mechanical), [id number]- Ionotophoresis 4mg /ml Dexamethasone , 79439 (1-2 muscles), 20561 (3+ muscles)- Dry Needling, Patient/Family education, Taping, Joint mobilization, Spinal mobilization, Cryotherapy, Moist heat, and Biofeedback  PLAN FOR NEXT SESSION: continue with shoulder and scapular strengthening; try some self posterior humeral glides?  Goodwin Kamphaus L Rmani Kapusta, PTA 04/05/2024, 3:48 PM

## 2024-04-12 ENCOUNTER — Ambulatory Visit: Payer: Self-pay | Admitting: Rehabilitation

## 2024-04-12 ENCOUNTER — Encounter: Payer: Self-pay | Admitting: Rehabilitation

## 2024-04-12 DIAGNOSIS — M6281 Muscle weakness (generalized): Secondary | ICD-10-CM

## 2024-04-12 DIAGNOSIS — G8929 Other chronic pain: Secondary | ICD-10-CM

## 2024-04-12 DIAGNOSIS — M62838 Other muscle spasm: Secondary | ICD-10-CM

## 2024-04-12 DIAGNOSIS — M25512 Pain in left shoulder: Secondary | ICD-10-CM | POA: Diagnosis not present

## 2024-04-12 DIAGNOSIS — R293 Abnormal posture: Secondary | ICD-10-CM

## 2024-04-12 DIAGNOSIS — R2689 Other abnormalities of gait and mobility: Secondary | ICD-10-CM

## 2024-04-12 NOTE — Therapy (Signed)
 OUTPATIENT PHYSICAL THERAPY UPPER EXTREMITY TREATMENT / MD PROGRESS NOTE   Patient Name: William Howe MRN: [id number] DOB:10-08-1990, 32 y.o., male Today's Date: 04/12/2024  END OF SESSION:  PT End of Session - 04/12/24 1629     Visit Number 22    Date for PT Re-Evaluation 04/27/24    Authorization Type [employer]    PT Start Time 1622    PT Stop Time 1710    PT Time Calculation (min) 48 min    Activity Tolerance Patient tolerated treatment well    Behavior During Therapy [facility] for tasks assessed/performed              History reviewed. No pertinent past medical history. Past Surgical History:  Procedure Laterality Date   HAND SURGERY     TOE SURGERY     There are no active problems to display for this patient.   PCP: Jason Leita Repine, FNP   REFERRING PROVIDER: Alexandra Lonni LABOR, *  REFERRING DIAG:  [phone] (ICD-10-CM) - Pain in left shoulder    THERAPY DIAG:  Chronic left shoulder pain  Muscle weakness (generalized)  Abnormal posture  Acute pain of left shoulder  Other muscle spasm  Other abnormalities of gait and mobility  RATIONALE FOR EVALUATION AND TREATMENT: Rehabilitation  ONSET DATE: 2/25  NEXT MD VISIT:     f/u PRN   SUBJECTIVE:                                                                                                                                                                                                         SUBJECTIVE STATEMENT:   04/12/24:  Patient reports goes for L shoulder MRI after therapy today.  States f/u with Dr Alexandra in 2 weeks.   States doesn't feel like he has made much improvement with respect to his shoulder pain.  States feels like he is stronger, but the pain is still limiting him considerably with lifting at home and at work.       EVAL:  Pt. Is a 33 y/o male referred to PT from Dr Alexandra for L RTC tendonitis/ L shoulder pain.  PT has been doing PT here with us  for the last couple of months.   He was initially referred to us  from Dr Colon for L neck pain.    States Pain started for no apparent reason in 2/25.  States Went ER then to neurosurgery, had cervical MRI showing mild disc bulging.   Did well with PT here and had good results with his L upper trap/neck pain.  However, he is still having some L neck pain, but c/o more of L shoulder pain.   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   States does a lot of heavy lifting and reaching when cooking which is hurting considerably.  He has a good HEP for neck stretching and has been doing this.   He is frustrated that his symptoms have not resolved in 5 months.  saw Dr Alexandra who feels he has some RTC pathology and requested further PT To address this issue.    Pt. States he received a L shoulder injection but doesn't feel it helped.  States pain is intermittent but worst with work.   Sitting and resting can relieve the pain completely at times, but almost always has a dull achy pain in the L anterior shoulder.  He sttaes the pain is from the lateral greater tuberosity area to anteriorly into the pec area.  However, he still does have some L sided neck pain.    PAIN: Are you having pain? Yes: NPRS scale: 1/10 now;  worst 7/10 Pain location: L shoulder anteriorly Pain description: dull always, sharp with lifting and reaching forward Aggravating factors: lifting, repeated reaching Relieving factors: sitting and resting  PERTINENT HISTORY:  noncontributory  PRECAUTIONS: None  HAND DOMINANCE: Right  RED FLAGS: None  WEIGHT BEARING RESTRICTIONS: No  FALLS:  Has patient fallen in last 6 months? 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 Left arm weakness. No known injury.   EXAM: MRI CERVICAL SPINE WITHOUT CONTRAST   TECHNIQUE: Multiplanar, multisequence MR imaging of the cervical spine was performed. No intravenous contrast was administered.   COMPARISON:  None Available.   FINDINGS: Alignment: Normal.   Vertebrae: No fracture, evidence of discitis, or bone lesion.   Cord: Normal signal throughout.   Posterior Fossa, vertebral arteries, paraspinal tissues: Negative.   Disc levels:   The central canal and foramina are widely patent at all levels. Disc height and hydration are maintained throughout. Minimal disc bulging C5-6 and C6-7 is noted.   IMPRESSION: Minimal disc bulging C5-6 and C6-7. The exam is otherwise negative. The central canal and foramina are widely patent at all levels.     Electronically Signed   By: Debby Prader M.D.   On: 12/06/2023 09:02  PATIENT SURVEYS:  Quick dash = 31.8  COGNITION: Overall cognitive status: Within functional limits for tasks assessed     SENSATION: WFL  POSTURE: Some rounded shoulder posture  CERVICAL ROM:   Active ROM A/PROM (deg) eval 04/05/24  Flexion 90 full  Extension 50 full  Right lateral flexion 75 full  Left lateral flexion 75 full  Right rotation 50 full  Left rotation 60 full   (Blank rows = not tested)   UPPER EXTREMITY ROM:   Active ROM Right eval Left eval L 04/05/24  Shoulder flexion 170 170 150 standing 165 supine  Shoulder extension 50 50   Shoulder  abduction 175 175 145 standing 164- supine  Shoulder adduction     Shoulder internal rotation To T10 T10 T7  Shoulder external rotation C7 c7 T3  Elbow flexion     Elbow extension     Wrist flexion      Wrist extension     Wrist ulnar deviation     Wrist radial deviation     Wrist pronation     Wrist supination     (Blank rows = not tested)  Active ROM Right eval Left eval  Thumb MCP (0-60)    Thumb IP (0-80)    Thumb Radial abd/add (0-55)     Thumb Palmar abd/add (0-45)     Thumb Opposition to Small Finger     Index MCP (0-90)     Index PIP (0-100)     Index DIP (0-70)      Long MCP (0-90)      Long PIP (0-100)      Long DIP (0-70)      Ring MCP (0-90)      Ring PIP (0-100)      Ring DIP (0-70)      Little MCP (0-90)      Little PIP (0-100)      Little DIP (0-70)      (Blank rows = not tested)  UPPER EXTREMITY MMT:  MMT Right eval Left eval R 03/29/24 L 03/29/24  Shoulder flexion 4 4 5 5   Shoulder extension  4-    Shoulder abduction 4 4- 5 5  Shoulder adduction      Shoulder internal rotation 5 5 5 5   Shoulder external rotation 4+ 4 5 4+  Middle trapezius  3+ 4+ 4-  Lower trapezius  3+ 4+ 4-  Elbow flexion 5 5    Elbow extension 5 5    Wrist flexion 5 5    Wrist extension 5 5    Wrist ulnar deviation      Wrist radial deviation      Wrist pronation      Wrist supination      Grip strength (lbs) =BUE     (Blank rows = not tested)  SHOULDER SPECIAL TESTS: Impingement tests: Neer impingement test: positive , Hawkins/Kennedy impingement test: negative, and Painful arc test: negative SLAP lesions: Biceps load test: negative Instability tests: Apprehension test: negative and Sulcus sign: negative Rotator cuff assessment: Drop arm test: negative, Empty can test: positive , Full can test: positive , Gerber lift off test: negative, Infraspinatus test: negative, and Hornblower's sign: negative Biceps assessment: Yergason's test: negative and Speed's test: positive  A/C shear test is negative Scapular movement is difficult to assess due to large body habitus, but appears normal without winging, tipping Spurlings is negative to the R, but positive in neutral and to the  L Cervical Distraction is negative  ULTT A and B are both negative LUE  JOINT MOBILITY TESTING:   Decreased posterior glide of the humerus, but otherwise feels hypermobile  PALPATION:  TTP over the L lateral cervical transverse processes and scalenes; L greater tuberosity around the anterior shoulder to subacromial space    TODAY'S TREATMENT:  04/12/24 UBE L4 3'F/3'B  MANUAL THERAPY: To promote reduced pain utilizing joint mobilization. Self shoulder mobilizations with instructions for posterior glides POE wt shifts, inferior glides seated with arm elevated into 90 abd, and traction holding/leaning away from Joymor x 30-50 reps Axial loading at various angles of abduction w/ ER/IR for labral check Long axis traction  with oscillations for pain relief   THERAPEUTIC ACTIVITIES: To improve functional performance.  Demonstration, verbal and tactile cues throughout for technique. Prone LT raise 3# x 3/10 LUE Prone MT raise 3# x 3/10 LUE Prone ER at 90 0#  x 3/10 LUE Prone ext 3# x 3/10 LUE Standing shoulder extension blue TB x 20 BUE  NEUROMUSCULAR RE-EDUCATION: To improve posture and proprioception and co contraction. Fitter S/S on mat table x 30 reps BUE Fitter F/B on floor kneeling x 30 reps BUE  04/05/24  UBE L4 3'F/3'B Chest stretch in doorway x 1'; 2 reps THERAPEUTIC ACTIVITIES:  Box lifts 20lb 2x10   NEUROMUSCULAR RE-EDUCATION: To improve posture. Standing scapular clocks RTB x 10 BUE Lower trap raises RTB  x 10 Low horiz ABD RTB 5x5 Standing B ER RTB 2x10 + scap retraction Standing B horiz ABD RTB 2x10 + scap retraction Seated thoracic ext in chair 5x5  MANUAL THERAPY: To promote normalized muscle tension, improve joint mobility and/or for pain modulation  L shoulder posterior glides to improve ROM grade 3-4\ KT tape for RTC: I strip from coracoid, along supraspinatus muscle to medial border of scapula I strip from middle deltoid to mid medial border of  scapula 04/02/24  UBE L3 3'F/3'B Chest stretch in doorway x 1' THERAPEUTIC ACTIVITIES:  Seated rows 35lb 2x10 mid rows Seated lat pulls 35lb 2x10 BLE  NEUROMUSCULAR RE-EDUCATION: To improve posture. Standing posterior humeral glides GTB w/ shoulder flexion, abd, ER at 90 deg S/L L shoulder abduction 3lb 2x10  S/L L shoulder horiz ABD 3lb 2x10  S/L L shoulder ER 3lb 2x10  Wall push ups w/ pball x 20 PATIENT EDUCATION:  Education details: co contraction for scapular stabilization when elevating the LUE  Person educated: Patient Education method: Explanation, Demonstration, Verbal cues, Tactile cues, and MedBridgeGO app access provided Education comprehension: verbalized understanding, verbal cues required, tactile cues required, and needs further education  HOME EXERCISE PROGRAM: Access Code: [email or web address] URL: [email or web address] Date: 03/29/2024 Prepared by: Sol Gaskins  Exercises - Prone Shoulder Extension - Single Arm  - 1 x daily - 7 x weekly - 3 sets - 10 reps - Prone Shoulder Horizontal Abduction  - 1 x daily - 7 x weekly - 3 sets - 10 reps - Prone Single Arm Shoulder Y  - 1 x daily - 7 x weekly - 3 sets - 10 reps - Prone Shoulder Row  - 1 x daily - 7 x weekly - 2 sets - 10 reps - Prone Shoulder External Rotation  - 1 x daily - 7 x weekly - 3 sets - 10 reps - Cervical Retraction Prone on Elbows  - 1 x daily - 7 x weekly - 3 sets - 10 reps - Seated Scapular Clock (1 to 7)  - 1 x daily - 7 x weekly - 3 sets - 10 reps - Standing Wall [employer] with Mini Swiss Ball  - 1 x daily - 7 x weekly - 3 sets - 10 reps - Standing Low Trap Setting with Resistance at Wall  - 1 x daily - 7 x weekly - 3 sets - 10 reps - Wall Clock with Theraband  - 1 x daily - 7 x weekly - 3 sets - 10 reps   ASSESSMENT:  CLINICAL IMPRESSION: 04/12/24:  Patient has been seen x 4 months PT for neck pain initially.  He did well with treatment and feels his neck and upper trap pain on the L  are much  improved.  He still has the pain, but states it is manageable and stretching helps.   His main c/o is L shoulder pain over the anterolateral L shoulder.  He reports continued difficulty and pain with doing his job as a Production designer, theatre/television/film at [employer].   He has to do a lot of lifting overhead and out.  He states the pain gets progressively worse as his shift goes on.  He has made improvements in his strength LUE, but it is not 5/5 yet.   He has not made any significant improvement in his pain.  HE still rates pain 7/10.  He feels his pain is unimproved in the L shoulder and is frustrated at lack of progress. He will f/u with ortho first week of September to discuss MRI results from today.   We have 2 more weeks left of PT and patient is advised that we will plan to D/C PT at that time.   He is agreeable to this plan.    EVAL:  William Howe is a 33 y.o. male who was referred to physical therapy for evaluation and treatment for L RTC tendonitis/L shoulder pain   Patient reports onset of R shoulder pain beginning 2/25 for no reason. Pain is worse with any lifting or reaching forward to cook, etc.  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  He has significantly weak periscapular musculature of 3+/5.    He still has L neck pain with positive Spurling's test as well as the positive RTC/impingement tests.   He is also quite hypermobile with passive ER/IR are well past 90 degrees.   Patient has deficits in cervical ROM, hyper flexibility, L shoulder strength, abnormal forward head posture, and TTP at L neck and shoulder which are interfering with ADLs and are impacting quality of life.  On QuickDASH patient scored 31.8/100 demonstrating 31.8% disability.  William Howe will benefit from skilled PT to address above deficits to improve mobility and activity tolerance with decreased pain interference.  OBJECTIVE IMPAIRMENTS: decreased ROM, decreased  strength, impaired UE functional use, postural dysfunction, and pain.   ACTIVITY LIMITATIONS: lifting, reach over head, and working at [employer]  PARTICIPATION LIMITATIONS: meal prep, cleaning, laundry, and occupation  PERSONAL FACTORS: Fitness and Time since onset of injury/illness/exacerbation are also affecting patient's functional outcome.   REHAB POTENTIAL: Good  CLINICAL DECISION MAKING: Evolving/moderate complexity  EVALUATION COMPLEXITY: Moderate   GOALS: Goals reviewed with patient? Yes  SHORT TERM GOALS: Target date: 03/30/2024  Patient will be independent with initial HEP to improve outcomes and carryover.  Baseline: 100% PT assist required for HEP completion with correct form  Goal status: MET- 03/29/24  2.  Patient will report 25% improvement in L neck and shoulder pain to improve QOL.  Baseline: 7/10 at work Goal status: IN PROGRESS-    LONG TERM GOALS: Target date: 04/27/2024   Patient will be independent with ongoing/advanced HEP for self-management at home.  Baseline: no periscapular advanced HEP yet Goal status: INITIAL  2.  Patient will report 50-75% improvement in L neck and shoulder pain to improve QOL.  Baseline: 7/10 worst Goal status: INITIAL  3.  Patient to demonstrate improved upright posture with posterior shoulder girdle engaged to promote improved functional UE use. Baseline: forward head with all movements Goal status: INITIAL  4.  Patient to improve cervical AROM to 100% without pain provocation to allow for increased  ease of ADLs.  Baseline: Refer to above UE ROM table Goal status: MET- 04/05/24  5.  Patient will demonstrate improved L shoulder strength to >/= 4+/5 for functional UE use. Baseline: Refer to above UE MMT table Goal status: IN PROGRESS- 03/29/24 see chart  6  Patient will report </= 50% on QuickDASH (MCID = 14%) overall more so to demonstrate improved functional ability.  Baseline: 31.8 Goal status: INITIAL  7.  Patient will be  able to lift 5-20 lbs overhead repeatedly for 15-10 minutes to be able to work full shift at [employer] with minimal to no pain Baseline: pain after 1-2 hrs of work Goal Status:  04/05/24- still pain at work  PLAN:  PT FREQUENCY: 1-2x/week  PT DURATION: 8 weeks  PLANNED INTERVENTIONS: 97164- PT Re-evaluation, 97750- Physical Performance Testing, 97110-Therapeutic exercises, 97530- Therapeutic activity, [id number]- Neuromuscular re-education, 97535- Self Care, 02859- Manual therapy, [id number]- Electrical stimulation (unattended), 97016- Vasopneumatic device, [id number]- Ultrasound, [id number]- Traction (mechanical), [id number]- Ionotophoresis 4mg /ml Dexamethasone , 79439 (1-2 muscles), 20561 (3+ muscles)- Dry Needling, Patient/Family education, Taping, Joint mobilization, Spinal mobilization, Cryotherapy, Moist heat, and Biofeedback  PLAN FOR NEXT SESSION: work on [employer] co contraction activities with Fitter, wall/ball, quadruped for scapular stability/strengthening;  Rosalea Annitta RED GARNETTE, PT 04/12/2024, 8:52 PM

## 2024-04-13 ENCOUNTER — Other Ambulatory Visit: Payer: Self-pay

## 2024-04-13 ENCOUNTER — Emergency Department (HOSPITAL_BASED_OUTPATIENT_CLINIC_OR_DEPARTMENT_OTHER)
Admission: EM | Admit: 2024-04-13 | Discharge: 2024-04-14 | Disposition: A | Discharge status: Home / Self Care | Attending: Emergency Medicine | Admitting: Emergency Medicine

## 2024-04-13 ENCOUNTER — Encounter (HOSPITAL_BASED_OUTPATIENT_CLINIC_OR_DEPARTMENT_OTHER): Payer: Self-pay

## 2024-04-13 DIAGNOSIS — M25512 Pain in left shoulder: Secondary | ICD-10-CM | POA: Diagnosis present

## 2024-04-13 DIAGNOSIS — M7522 Bicipital tendinitis, left shoulder: Secondary | ICD-10-CM | POA: Diagnosis not present

## 2024-04-13 NOTE — ED Triage Notes (Addendum)
 Nontraumatic left shoulder pain that has been ongoing for a while.   Limiter ROM due to pain.   MRI completed yesterday that shows tendonitis of bicep.

## 2024-04-14 MED ORDER — KETOROLAC TROMETHAMINE 60 MG/2ML IM SOLN
30.0000 mg | Freq: Once | INTRAMUSCULAR | Status: AC
  Administered 2024-04-14: 30 mg via INTRAMUSCULAR
  Filled 2024-04-14: qty 2

## 2024-04-14 MED ORDER — OXYCODONE-ACETAMINOPHEN 5-325 MG PO TABS
2.0000 | ORAL_TABLET | Freq: Once | ORAL | Status: AC
  Administered 2024-04-14: 2 via ORAL
  Filled 2024-04-14: qty 2

## 2024-04-14 MED ORDER — MELOXICAM 7.5 MG PO TABS: 7.5000 mg | ORAL_TABLET | Freq: Two times a day (BID) | ORAL | 0 refills | Status: DC

## 2024-04-14 MED ORDER — CYCLOBENZAPRINE HCL 10 MG PO TABS: 10.0000 mg | ORAL_TABLET | Freq: Two times a day (BID) | ORAL | 0 refills | Status: DC | PRN

## 2024-04-14 NOTE — ED Provider Notes (Signed)
Emergency Department Provider Note  TRIAGE NOTE: Nontraumatic left shoulder pain that has been ongoing for a while.   Limiter ROM due to pain.   MRI completed yesterday that shows tendonitis of bicep.   HISTORY  Chief Complaint Shoulder Pain   HPI William Howe is a 33 y.o. male with  right-handed individual who presents with intense left shoulder pain that has been ongoing for the past year, worsening recently. The pain began earlier this year in a different location (upper shoulder/neck) and was initially attributed to a pinched nerve; an MRI performed yesterday showed inflammation of the biceps tendon. The current episode started at work, where the patient experienced shaking of the arm, nausea, and lightheadedness, prompting early departure. The pain is described as intense, with associated spasms that occur throughout the arm, especially before sleep, disrupting sleep until 7:30 am. The patient reports no relief from a recent steroid injection given two months ago, which was administered in the upper shoulder rather than the painful area. Pain is constant, interferes with work duties (managing long shifts and frequent movement), and has led to difficulty sleeping. The patient has tried over-the-counter acetaminophen  and ibuprofen , as well as a muscle relaxant (methocarbamol ) prescribed by primary care, with minimal benefit. No sling has been used. The patient has a history of prior shoulder pain treated with physical therapy, which initially helped but later worsened other shoulder areas. The patient is scheduled for an orthopedic appointment on the third of the month and has a physical therapy appointment on Tuesday. No red-flag symptoms such as fever, weakness, or loss of function are reported. History was obtained from the patient.  PMH History reviewed. No pertinent past medical history.  Home Medications Prior to Admission medications   Medication Sig Start Date End Date Taking?  Authorizing Provider  cyclobenzaprine  (FLEXERIL ) 10 MG tablet Take 1 tablet (10 mg total) by mouth 2 (two) times daily as needed for muscle spasms. 04/14/24  Yes Mekaila Tarnow, Selinda, MD  meloxicam  (MOBIC ) 7.5 MG tablet Take 1 tablet (7.5 mg total) by mouth 2 (two) times daily. 04/14/24  Yes Tahani Potier, Selinda, MD  ibuprofen  (ADVIL ,MOTRIN ) 200 MG tablet Take 600 mg by mouth every 6 (six) hours as needed. For pain    [provider]  lidocaine  (LIDODERM ) 5 % Place 1 patch onto the skin daily. Remove & Discard patch within 12 hours or as directed by MD 10/08/23   Carita Senior, MD  methylPREDNISolone  (MEDROL  DOSEPAK) 4 MG TBPK tablet Taper as directed Patient not taking: Reported on 03/01/2024 01/20/24   Tiea Manninen Leita Repine, FNP  naproxen  (NAPROSYN ) 500 MG tablet Take 1 tablet (500 mg total) by mouth 2 (two) times daily. 06/12/20   Shepard Clinch, PA-C    Social History Social History   Tobacco Use   Smoking status: Never   Smokeless tobacco: Never  Vaping Use   Vaping status: Never Used  Substance Use Topics   Alcohol use: No   Drug use: No    Review of Systems: Documented in HPI ____________________________________________  PHYSICAL EXAM: VITAL SIGNS: Triage: Blood pressure (!) 143/105, pulse 80, temperature 98.3 F (36.8 C), resp. rate 20, height 6' 2 (1.88 m), weight 127 kg, SpO2 99%.  Vitals:   04/13/24 2335 04/13/24 2336  BP:  (!) 143/105  Pulse:  80  Resp:  20  Temp:  98.3 F (36.8 C)  SpO2:  99%  Weight: 127 kg   Height: 6' 2 (1.88 m)     Physical Exam Vitals  and nursing note reviewed.  Constitutional:      Appearance: He is well-developed.  HENT:     Head: Normocephalic and atraumatic.  Cardiovascular:     Rate and Rhythm: Normal rate.  Pulmonary:     Effort: Pulmonary effort is normal. No respiratory distress.  Abdominal:     General: There is no distension.  Musculoskeletal:        General: Tenderness (left anterior shoulder/proximal bicep) present.  Normal range of motion.     Cervical back: Normal range of motion.     Comments: Grip strength, ok sign, finger spread all ok  Neurological:     Mental Status: He is alert.       ____________________________________________   LABS (all labs ordered are listed, but only abnormal results are displayed)  Labs Reviewed - No data to display ____________________________________________  EKG   EKG Interpretation Date/Time:    Ventricular Rate:    PR Interval:    QRS Duration:    QT Interval:    QTC Calculation:   R Axis:      Text Interpretation:          ____________________________________________  RADIOLOGY  No results found. ____________________________________________  PROCEDURES  Procedure(s) performed:   Procedures ____________________________________________  INITIAL IMPRESSION / ASSESSMENT AND PLAN  Initial Evaluation:  Patient presents with acute left shoulder pain, shaking arm, nausea, and lightheadedness after a work shift, reporting ongoing pain and prior MRI showing biceps tendon inflammation, no recent injury.  Differential Diagnosis: Differential diagnosis includes but is not limited to: bicep tendonitis, rotator cuff injury, shoulder impingement syndrome, cervical radiculopathy, and muscle strain.  Plan:  Advise use of a sling for comfort and to limit movement. Prescribe muscle relaxer (methocarbamol ) for nighttime use to aid sleep and reduce spasms. Prescribe stronger anti-inflammatory medication for pain relief. Discuss possibility of opiate for acute pain relief if needed during visit, but emphasize limited use. Provide note to work regarding sling use and limited lifting. Encourage continuation of physical therapy and follow-up with orthopedics on Tuesday.  ED Course  Images ordered viewed and obtained by myself. Agree with Radiology interpretation. Details in ED course.  Labs ordered reviewed by myself as detailed in ED course.   Consultations obtained/considered detailed in ED course.       FINAL IMPRESSION Final diagnoses:  Biceps tendinitis of left upper extremity   A medical screening exam was performed and I feel the patient has had an appropriate workup for their chief complaint at this time and likelihood of emergent condition existing is low. They have been counseled on decision, DISCHARGE, follow up and which symptoms necessitate immediate return to the emergency department. They or their family verbally stated understanding and agreement with plan and discharged in stable condition.   ____________________________________________   NEW OUTPATIENT MEDICATIONS STARTED DURING THIS VISIT:  Discharge Medication List as of 04/14/2024 12:25 AM     START taking these medications   Details  cyclobenzaprine  (FLEXERIL ) 10 MG tablet Take 1 tablet (10 mg total) by mouth 2 (two) times daily as needed for muscle spasms., Starting Sat 04/14/2024, Normal    meloxicam  (MOBIC ) 7.5 MG tablet Take 1 tablet (7.5 mg total) by mouth 2 (two) times daily., Starting Sat 04/14/2024, Normal        Note:  This note was prepared with assistance of Dragon voice recognition software. Occasional wrong-word or sound-a-like substitutions may have occurred due to the inherent limitations of voice recognition software.    Debborah Alonge, Selinda, MD 04/14/24  0444  

## 2024-04-17 ENCOUNTER — Ambulatory Visit

## 2024-04-17 DIAGNOSIS — M62838 Other muscle spasm: Secondary | ICD-10-CM

## 2024-04-17 DIAGNOSIS — R293 Abnormal posture: Secondary | ICD-10-CM

## 2024-04-17 DIAGNOSIS — M25512 Pain in left shoulder: Secondary | ICD-10-CM

## 2024-04-17 DIAGNOSIS — R2689 Other abnormalities of gait and mobility: Secondary | ICD-10-CM

## 2024-04-17 DIAGNOSIS — M6281 Muscle weakness (generalized): Secondary | ICD-10-CM

## 2024-04-17 NOTE — Therapy (Signed)
 OUTPATIENT PHYSICAL THERAPY UPPER EXTREMITY TREATMENT / MD PROGRESS NOTE   Patient Name: William Howe MRN: [id number] DOB:Jun 23, 1991, 33 y.o., male Today's Date: 04/17/2024  END OF SESSION:  PT End of Session - 04/17/24 1618     Visit Number 23    Date for PT Re-Evaluation 04/27/24    Authorization Type [employer]    PT Start Time 1534    PT Stop Time 1616    PT Time Calculation (min) 42 min    Activity Tolerance Patient tolerated treatment well    Behavior During Therapy WFL for tasks assessed/performed               History reviewed. No pertinent past medical history. Past Surgical History:  Procedure Laterality Date   HAND SURGERY     TOE SURGERY     There are no active problems to display for this patient.   PCP: Jason Leita Repine, FNP   REFERRING PROVIDER: Jason Leita Repine,*  REFERRING DIAG:  F74.487 (ICD-10-CM) - Pain in left shoulder    THERAPY DIAG:  Chronic left shoulder pain  Muscle weakness (generalized)  Abnormal posture  Acute pain of left shoulder  Other muscle spasm  Other abnormalities of gait and mobility  RATIONALE FOR EVALUATION AND TREATMENT: Rehabilitation  ONSET DATE: 2/25  NEXT MD VISIT:     f/u PRN   SUBJECTIVE:                                                                                                                                                                                                         SUBJECTIVE STATEMENT:   04/12/24:  Patient reports goes for L shoulder MRI after therapy today.  States f/u with Dr Alexandra in 2 weeks.   States doesn't feel like he has made much improvement with respect to his shoulder pain.  States feels like he is stronger, but the pain is still limiting him considerably with lifting at home and at work.       EVAL:  Pt. Is a 33 y/o male referred to PT from Dr Alexandra for L RTC tendonitis/ L shoulder pain.  PT has been doing PT here with us  for the last couple of months.   He was initially referred to us  from Dr Colon for L neck pain.    States Pain started for no apparent reason in 2/25.  States Went ER then to neurosurgery, had cervical MRI showing mild disc bulging.   Did well with PT here and had good results with his L upper trap/neck pain.  However, he is still having some L neck pain, but c/o more of L shoulder pain.   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   States does a lot of heavy lifting and reaching when cooking which is hurting considerably.  He has a good HEP for neck stretching and has been doing this.   He is frustrated that his symptoms have not resolved in 5 months.  saw Dr Alexandra who feels he has some RTC pathology and requested further PT To address this issue.    Pt. States he received a L shoulder injection but doesn't feel it helped.  States pain is intermittent but worst with work.   Sitting and resting can relieve the pain completely at times, but almost always has a dull achy pain in the L anterior shoulder.  He sttaes the pain is from the lateral greater tuberosity area to anteriorly into the pec area.  However, he still does have some L sided neck pain.    PAIN: Are you having pain? Yes: NPRS scale: 1/10 now;  worst 7/10 Pain location: L shoulder anteriorly Pain description: dull always, sharp with lifting and reaching forward Aggravating factors: lifting, repeated reaching Relieving factors: sitting and resting  PERTINENT HISTORY:  noncontributory  PRECAUTIONS: None  HAND DOMINANCE: Right  RED FLAGS: None  WEIGHT BEARING RESTRICTIONS: No  FALLS:  Has patient fallen in last 6 months? 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 Left arm weakness. No known injury.   EXAM: MRI CERVICAL SPINE WITHOUT CONTRAST   TECHNIQUE: Multiplanar, multisequence MR imaging of the cervical spine was performed. No intravenous contrast was administered.   COMPARISON:  None Available.   FINDINGS: Alignment: Normal.   Vertebrae: No fracture, evidence of discitis, or bone lesion.   Cord: Normal signal throughout.   Posterior Fossa, vertebral arteries, paraspinal tissues: Negative.   Disc levels:   The central canal and foramina are widely patent at all levels. Disc height and hydration are maintained throughout. Minimal disc bulging C5-6 and C6-7 is noted.   IMPRESSION: Minimal disc bulging C5-6 and C6-7. The exam is otherwise negative. The central canal and foramina are widely patent at all levels.     Electronically Signed   By: Debby Prader M.D.   On: 12/06/2023 09:02  PATIENT SURVEYS:  Quick dash = 31.8  COGNITION: Overall cognitive status: Within functional limits for tasks assessed     SENSATION: WFL  POSTURE: Some rounded shoulder posture  CERVICAL ROM:   Active ROM A/PROM (deg) eval 04/05/24  Flexion 90 full  Extension 50 full  Right lateral flexion 75 full  Left lateral flexion 75 full  Right rotation 50 full  Left rotation 60 full   (Blank rows = not tested)   UPPER EXTREMITY ROM:   Active ROM Right eval Left eval L 04/05/24  Shoulder flexion 170 170 150 standing 165 supine  Shoulder extension 50 50   Shoulder  abduction 175 175 145 standing 164- supine  Shoulder adduction     Shoulder internal rotation To T10 T10 T7  Shoulder external rotation C7 c7 T3  Elbow flexion     Elbow extension     Wrist flexion      Wrist extension     Wrist ulnar deviation     Wrist radial deviation     Wrist pronation     Wrist supination     (Blank rows = not tested)  Active ROM Right eval Left eval  Thumb MCP (0-60)    Thumb IP (0-80)    Thumb Radial abd/add (0-55)     Thumb Palmar abd/add (0-45)     Thumb Opposition to Small Finger     Index MCP (0-90)     Index PIP (0-100)     Index DIP (0-70)      Long MCP (0-90)      Long PIP (0-100)      Long DIP (0-70)      Ring MCP (0-90)      Ring PIP (0-100)      Ring DIP (0-70)      Little MCP (0-90)      Little PIP (0-100)      Little DIP (0-70)      (Blank rows = not tested)  UPPER EXTREMITY MMT:  MMT Right eval Left eval R 03/29/24 L 03/29/24  Shoulder flexion 4 4 5 5   Shoulder extension  4-    Shoulder abduction 4 4- 5 5  Shoulder adduction      Shoulder internal rotation 5 5 5 5   Shoulder external rotation 4+ 4 5 4+  Middle trapezius  3+ 4+ 4-  Lower trapezius  3+ 4+ 4-  Elbow flexion 5 5    Elbow extension 5 5    Wrist flexion 5 5    Wrist extension 5 5    Wrist ulnar deviation      Wrist radial deviation      Wrist pronation      Wrist supination      Grip strength (lbs) =BUE     (Blank rows = not tested)  SHOULDER SPECIAL TESTS: Impingement tests: Neer impingement test: positive , Hawkins/Kennedy impingement test: negative, and Painful arc test: negative SLAP lesions: Biceps load test: negative Instability tests: Apprehension test: negative and Sulcus sign: negative Rotator cuff assessment: Drop arm test: negative, Empty can test: positive , Full can test: positive , Gerber lift off test: negative, Infraspinatus test: negative, and Hornblower's sign: negative Biceps assessment: Yergason's test: negative and Speed's test: positive  A/C shear test is negative Scapular movement is difficult to assess due to large body habitus, but appears normal without winging, tipping Spurlings is negative to the R, but positive in neutral and to the  L Cervical Distraction is negative  ULTT A and B are both negative LUE  JOINT MOBILITY TESTING:   Decreased posterior glide of the humerus, but otherwise feels hypermobile  PALPATION:  TTP over the L lateral cervical transverse processes and scalenes; L greater tuberosity around the anterior shoulder to subacromial space    TODAY'S TREATMENT:  04/12/24 UBE L4 3'F/3'B  MANUAL THERAPY: To promote reduced pain utilizing joint mobilization. IASTM s/s tool to L long head of bicep followed by manual STM over bicep and anterior shoulder area.  NEUROMUSCULAR RE-EDUCATION: To improve posture and proprioception and co contraction. Prone on green pball:  I,T,Y,W 2x10 each way Upside down bosu plank position  weight shifts side to side x 10 Upside down bosu plank position high knees x 10 started to irritate L bicep- discontinued  04/05/24  UBE L4 3'F/3'B Chest stretch in doorway x 1'; 2 reps THERAPEUTIC ACTIVITIES:  Box lifts 20lb 2x10   NEUROMUSCULAR RE-EDUCATION: To improve posture. Standing scapular clocks RTB x 10 BUE Lower trap raises RTB  x 10 Low horiz ABD RTB 5x5 Standing B ER RTB 2x10 + scap retraction Standing B horiz ABD RTB 2x10 + scap retraction Seated thoracic ext in chair 5x5  MANUAL THERAPY: To promote normalized muscle tension, improve joint mobility and/or for pain modulation  L shoulder posterior glides to improve ROM grade 3-4\ KT tape for RTC: I strip from coracoid, along supraspinatus muscle to medial border of scapula I strip from middle deltoid to mid medial border of scapula 04/02/24  UBE L3 3'F/3'B Chest stretch in doorway x 1' THERAPEUTIC ACTIVITIES:  Seated rows 35lb 2x10 mid rows Seated lat pulls 35lb 2x10 BLE  NEUROMUSCULAR RE-EDUCATION: To improve posture. Standing posterior humeral glides GTB w/ shoulder flexion, abd, ER at 90 deg S/L L shoulder abduction 3lb 2x10  S/L L shoulder horiz ABD 3lb 2x10  S/L L shoulder ER 3lb 2x10  Wall push ups w/  pball x 20 PATIENT EDUCATION:  Education details: co contraction for scapular stabilization when elevating the LUE  Person educated: Patient Education method: Explanation, Demonstration, Verbal cues, Tactile cues, and MedBridgeGO app access provided Education comprehension: verbalized understanding, verbal cues required, tactile cues required, and needs further education  HOME EXERCISE PROGRAM: Access Code: [email or web address] URL: [email or web address] Date: 03/29/2024 Prepared by: Sol Gaskins  Exercises - Prone Shoulder Extension - Single Arm  - 1 x daily - 7 x weekly - 3 sets - 10 reps - Prone Shoulder Horizontal Abduction  - 1 x daily - 7 x weekly - 3 sets - 10 reps - Prone Single Arm Shoulder Y  - 1 x daily - 7 x weekly - 3 sets - 10 reps - Prone Shoulder Row  - 1 x daily - 7 x weekly - 2 sets - 10 reps - Prone Shoulder External Rotation  - 1 x daily - 7 x weekly - 3 sets - 10 reps - Cervical Retraction Prone on Elbows  - 1 x daily - 7 x weekly - 3 sets - 10 reps - Seated Scapular Clock (1 to 7)  - 1 x daily - 7 x weekly - 3 sets - 10 reps - Standing Wall [employer] with Mini Swiss Ball  - 1 x daily - 7 x weekly - 3 sets - 10 reps - Standing Low Trap Setting with Resistance at Wall  - 1 x daily - 7 x weekly - 3 sets - 10 reps - Wall Clock with Theraband  - 1 x daily - 7 x weekly - 3 sets - 10 reps   ASSESSMENT:  CLINICAL IMPRESSION: Pt notes going to ED Friday d/t increased R shoulder pain. He was put in a sling for work, which he reports has helped a lot. He has gotten his MRI and will see Dr. Alexandra next week for evaluation. I believe he would greatly benefit from iontophoresis, so will see if we could obtain MD orders.    EVAL:  William Howe is a 33 y.o. male who was referred to physical therapy for evaluation and treatment for L RTC tendonitis/L shoulder pain   Patient reports onset of R shoulder pain beginning 2/25 for no reason. Pain  is worse with any lifting or  reaching forward to cook, etc.  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  He has significantly weak periscapular musculature of 3+/5.    He still has L neck pain with positive Spurling's test as well as the positive RTC/impingement tests.   He is also quite hypermobile with passive ER/IR are well past 90 degrees.   Patient has deficits in cervical ROM, hyper flexibility, L shoulder strength, abnormal forward head posture, and TTP at L neck and shoulder which are interfering with ADLs and are impacting quality of life.  On QuickDASH patient scored 31.8/100 demonstrating 31.8% disability.  Jantzen will benefit from skilled PT to address above deficits to improve mobility and activity tolerance with decreased pain interference.  OBJECTIVE IMPAIRMENTS: decreased ROM, decreased strength, impaired UE functional use, postural dysfunction, and pain.   ACTIVITY LIMITATIONS: lifting, reach over head, and working at [employer]  PARTICIPATION LIMITATIONS: meal prep, cleaning, laundry, and occupation  PERSONAL FACTORS: Fitness and Time since onset of injury/illness/exacerbation are also affecting patient's functional outcome.   REHAB POTENTIAL: Good  CLINICAL DECISION MAKING: Evolving/moderate complexity  EVALUATION COMPLEXITY: Moderate   GOALS: Goals reviewed with patient? Yes  SHORT TERM GOALS: Target date: 03/30/2024  Patient will be independent with initial HEP to improve outcomes and carryover.  Baseline: 100% PT assist required for HEP completion with correct form  Goal status: MET- 03/29/24  2.  Patient will report 25% improvement in L neck and shoulder pain to improve QOL.  Baseline: 7/10 at work Goal status: IN PROGRESS-    LONG TERM GOALS: Target date: 04/27/2024   Patient will be independent with ongoing/advanced HEP for self-management at home.  Baseline: no periscapular advanced HEP yet Goal status:  INITIAL  2.  Patient will report 50-75% improvement in L neck and shoulder pain to improve QOL.  Baseline: 7/10 worst Goal status: INITIAL  3.  Patient to demonstrate improved upright posture with posterior shoulder girdle engaged to promote improved functional UE use. Baseline: forward head with all movements Goal status: INITIAL  4.  Patient to improve cervical AROM to 100% without pain provocation to allow for increased ease of ADLs.  Baseline: Refer to above UE ROM table Goal status: MET- 04/05/24  5.  Patient will demonstrate improved L shoulder strength to >/= 4+/5 for functional UE use. Baseline: Refer to above UE MMT table Goal status: IN PROGRESS- 03/29/24 see chart  6  Patient will report </= 50% on QuickDASH (MCID = 14%) overall more so to demonstrate improved functional ability.  Baseline: 31.8 Goal status: INITIAL  7.  Patient will be able to lift 5-20 lbs overhead repeatedly for 15-10 minutes to be able to work full shift at [employer] with minimal to no pain Baseline: pain after 1-2 hrs of work Goal Status:  04/05/24- still pain at work  PLAN:  PT FREQUENCY: 1-2x/week  PT DURATION: 8 weeks  PLANNED INTERVENTIONS: 97164- PT Re-evaluation, 97750- Physical Performance Testing, 97110-Therapeutic exercises, 97530- Therapeutic activity, [id number]- Neuromuscular re-education, 97535- Self Care, [id number]- Manual therapy, [id number]- Electrical stimulation (unattended), 97016- Vasopneumatic device, [id number]- Ultrasound, [id number]- Traction (mechanical), [id number]- Ionotophoresis 4mg /ml Dexamethasone , 79439 (1-2 muscles), 20561 (3+ muscles)- Dry Needling, Patient/Family education, Taping, Joint mobilization, Spinal mobilization, Cryotherapy, Moist heat, and Biofeedback  PLAN FOR NEXT SESSION: work on [employer] co contraction activities with Fitter, wall/ball, quadruped for scapular stability/strengthening;  [employer]  Rogenia Werntz L Shareena Nusz, PTA 04/17/2024, 4:18 PM

## 2024-04-25 ENCOUNTER — Encounter: Admitting: Rehabilitation

## 2024-04-30 ENCOUNTER — Ambulatory Visit: Attending: Orthopedic Surgery

## 2024-04-30 DIAGNOSIS — M25512 Pain in left shoulder: Secondary | ICD-10-CM | POA: Insufficient documentation

## 2024-04-30 DIAGNOSIS — R2689 Other abnormalities of gait and mobility: Secondary | ICD-10-CM | POA: Diagnosis present

## 2024-04-30 DIAGNOSIS — G8929 Other chronic pain: Secondary | ICD-10-CM | POA: Diagnosis present

## 2024-04-30 DIAGNOSIS — M6281 Muscle weakness (generalized): Secondary | ICD-10-CM | POA: Insufficient documentation

## 2024-04-30 DIAGNOSIS — M62838 Other muscle spasm: Secondary | ICD-10-CM | POA: Insufficient documentation

## 2024-04-30 DIAGNOSIS — R293 Abnormal posture: Secondary | ICD-10-CM | POA: Insufficient documentation

## 2024-04-30 NOTE — Therapy (Addendum)
 OUTPATIENT PHYSICAL THERAPY UPPER EXTREMITY TREATMENT / RECERTIFICATION   Patient Name: William Howe MRN: [id number] DOB:[birth year], 33 y.o., male Today's Date: 04/30/2024  END OF SESSION:  PT End of Session - 04/30/24 1526     Visit Number 24    Date for PT Re-Evaluation 06/11/24    Authorization Type [employer]    PT Start Time 1451   late arrival   PT Stop Time 1525    PT Time Calculation (min) 34 min    Activity Tolerance Patient tolerated treatment well    Behavior During Therapy [facility] for tasks assessed/performed                History reviewed. No pertinent past medical history. Past Surgical History:  Procedure Laterality Date   HAND SURGERY     TOE SURGERY     There are no active problems to display for this patient.   PCP: Jason Leita Repine, FNP   REFERRING PROVIDER: Alexandra Lonni LABOR, *  REFERRING DIAG:  [phone] (ICD-10-CM) - Pain in left shoulder    THERAPY DIAG:  Chronic left shoulder pain  Muscle weakness (generalized)  Abnormal posture  Acute pain of left shoulder  Other muscle spasm  Other abnormalities of gait and mobility  RATIONALE FOR EVALUATION AND TREATMENT: Rehabilitation  ONSET DATE: 2/25   NEXT MD VISIT:     f/u PRN   SUBJECTIVE:                                                                                                                                                                                                         SUBJECTIVE STATEMENT:   Doctor wants to continue PT and do surgery as a last resort   EVAL:  Pt. Is a 33 y/o male referred to PT from Dr Alexandra for L RTC tendonitis/ L shoulder pain.  PT has been doing PT here with us  for the last couple of months.  He was initially referred to us  from Dr Colon for L neck pain.    States Pain started for no apparent reason in 2/25.  States Went ER then to neurosurgery, had cervical MRI showing mild disc bulging.   Did well with PT here and had good results  with his L upper trap/neck pain.   However, he is still having some L neck pain, but c/o more of L shoulder pain.   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   States does a lot of heavy lifting and reaching  when cooking which is hurting considerably.  He has a good HEP for neck stretching and has been doing this.   He is frustrated that his symptoms have not resolved in 5 months.  saw Dr Alexandra who feels he has some RTC pathology and requested further PT To address this issue.    Pt. States he received a L shoulder injection but doesn't feel it helped.  States pain is intermittent but worst with work.   Sitting and resting can relieve the pain completely at times, but almost always has a dull achy pain in the L anterior shoulder.  He sttaes the pain is from the lateral greater tuberosity area to anteriorly into the pec area.  However, he still does have some L sided neck pain.    PAIN: Are you having pain? Yes: NPRS scale: 1/10 now;  worst 7/10 Pain location: L shoulder anteriorly Pain description: dull always, sharp with lifting and reaching forward Aggravating factors: lifting, repeated reaching Relieving factors: sitting and resting  PERTINENT HISTORY:  noncontributory  PRECAUTIONS: None  HAND DOMINANCE: Right  RED FLAGS: None  WEIGHT BEARING RESTRICTIONS: No  FALLS:  Has patient fallen in last 6 months? 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 Left arm weakness. No known injury.   EXAM: MRI CERVICAL SPINE WITHOUT CONTRAST   TECHNIQUE: Multiplanar, multisequence MR imaging of the cervical spine was performed. No intravenous contrast was administered.   COMPARISON:  None Available.   FINDINGS: Alignment: Normal.   Vertebrae: No fracture, evidence of discitis, or bone lesion.   Cord: Normal signal throughout.   Posterior Fossa, vertebral arteries, paraspinal tissues: Negative.   Disc levels:   The central canal and foramina are widely patent at all levels. Disc height and hydration are maintained throughout. Minimal disc bulging C5-6 and C6-7 is noted.   IMPRESSION: Minimal disc bulging C5-6 and C6-7. The exam is otherwise negative. The central canal and foramina are widely patent at all levels.     Electronically Signed   By: Debby Prader M.D.   On: 12/06/2023 09:02  PATIENT SURVEYS:  Quick dash = 31.8  COGNITION: Overall cognitive status: Within functional limits for tasks assessed     SENSATION: WFL  POSTURE: Some rounded shoulder posture  CERVICAL ROM:   Active ROM A/PROM (deg) eval 04/05/24  Flexion 90 full  Extension 50 full  Right lateral flexion 75 full  Left lateral flexion 75 full  Right rotation 50 full  Left rotation 60 full   (Blank rows = not tested)   UPPER EXTREMITY ROM:   Active ROM Right eval Left eval L 04/05/24 L 04/30/24 standing  Shoulder flexion 170 170 150 standing 165 supine 165  Shoulder extension 50 50    Shoulder abduction 175 175 145 standing 164- supine 155  Shoulder adduction      Shoulder internal rotation To T10 T10 T7 T7  Shoulder external rotation C7 c7 T3 T3  Elbow flexion      Elbow  extension      Wrist flexion      Wrist extension      Wrist ulnar deviation      Wrist radial deviation      Wrist pronation      Wrist supination      (Blank rows = not tested)  Active ROM Right eval Left eval  Thumb MCP (0-60)    Thumb  IP (0-80)    Thumb Radial abd/add (0-55)     Thumb Palmar abd/add (0-45)     Thumb Opposition to Small Finger     Index MCP (0-90)     Index PIP (0-100)     Index DIP (0-70)      Long MCP (0-90)      Long PIP (0-100)      Long DIP (0-70)      Ring MCP (0-90)      Ring PIP (0-100)      Ring DIP (0-70)      Little MCP (0-90)      Little PIP (0-100)      Little DIP (0-70)      (Blank rows = not tested)  UPPER EXTREMITY MMT:  MMT Right eval Left eval R 03/29/24 L 03/29/24 L 04/30/24  Shoulder flexion 4 4 5 5 5   Shoulder extension  4-     Shoulder abduction 4 4- 5 5 5   Shoulder adduction       Shoulder internal rotation 5 5 5 5 5   Shoulder external rotation 4+ 4 5 4+ 5  Middle trapezius  3+ 4+ 4- 4+  Lower trapezius  3+ 4+ 4- 4  Elbow flexion 5 5     Elbow extension 5 5     Wrist flexion 5 5     Wrist extension 5 5     Wrist ulnar deviation       Wrist radial deviation       Wrist pronation       Wrist supination       Grip strength (lbs) =BUE      (Blank rows = not tested)  SHOULDER SPECIAL TESTS: Impingement tests: Neer impingement test: positive , Hawkins/Kennedy impingement test: negative, and Painful arc test: negative SLAP lesions: Biceps load test: negative Instability tests: Apprehension test: negative and Sulcus sign: negative Rotator cuff assessment: Drop arm test: negative, Empty can test: positive , Full can test: positive , Gerber lift off test: negative, Infraspinatus test: negative, and Hornblower's sign: negative Biceps assessment: Yergason's test: negative and Speed's test: positive  A/C shear test is negative Scapular movement is difficult to assess due to large body habitus, but appears normal without winging, tipping Spurlings is negative to the R, but positive in neutral and to the L Cervical Distraction is negative  ULTT A and B are both negative LUE  JOINT MOBILITY TESTING:   Decreased posterior glide of the humerus, but otherwise feels  hypermobile  PALPATION:  TTP over the L lateral cervical transverse processes and scalenes; L greater tuberosity around the anterior shoulder to subacromial space    TODAY'S TREATMENT:  04/30/24 Assessed goals for recert: see below Serratus slide with RTB on wall x 10 Horiz abd YTB 2x10 Standing self humeral glides for pain relief 2x10  04/12/24 UBE L4 3'F/3'B  MANUAL THERAPY: To promote reduced pain utilizing joint mobilization. IASTM s/s tool to L long head of bicep followed by manual STM over bicep and anterior shoulder area.  NEUROMUSCULAR RE-EDUCATION: To improve posture and  proprioception and co contraction. Prone on green pball:  I,T,Y,W 2x10 each way Upside down bosu plank position weight shifts side to side x 10 Upside down bosu plank position high knees x 10 started to irritate L bicep- discontinued  04/05/24  UBE L4 3'F/3'B Chest stretch in doorway x 1'; 2 reps THERAPEUTIC ACTIVITIES:  Box lifts 20lb 2x10   NEUROMUSCULAR RE-EDUCATION: To improve posture. Standing scapular clocks RTB x 10 BUE Lower trap raises RTB  x 10 Low horiz ABD RTB 5x5 Standing B ER RTB 2x10 + scap retraction Standing B horiz ABD RTB 2x10 + scap retraction Seated thoracic ext in chair 5x5  MANUAL THERAPY: To promote normalized muscle tension, improve joint mobility and/or for pain modulation  L shoulder posterior glides to improve ROM grade 3-4\ KT tape for RTC: I strip from coracoid, along supraspinatus muscle to medial border of scapula I strip from middle deltoid to mid medial border of scapula 04/02/24  UBE L3 3'F/3'B Chest stretch in doorway x 1' THERAPEUTIC ACTIVITIES:  Seated rows 35lb 2x10 mid rows Seated lat pulls 35lb 2x10 BLE  NEUROMUSCULAR RE-EDUCATION: To improve posture. Standing posterior humeral glides GTB w/ shoulder flexion, abd, ER at 90 deg S/L L shoulder abduction 3lb 2x10  S/L L shoulder horiz ABD 3lb 2x10  S/L L shoulder ER 3lb 2x10  Wall push ups w/ pball x  20 PATIENT EDUCATION:  Education details: co contraction for scapular stabilization when elevating the LUE  Person educated: Patient Education method: Explanation, Demonstration, Verbal cues, Tactile cues, and MedBridgeGO app access provided Education comprehension: verbalized understanding, verbal cues required, tactile cues required, and needs further education  HOME EXERCISE PROGRAM: Access Code: [email or web address] URL: [email or web address] Date: 03/29/2024 Prepared by: Sol Gaskins  Exercises - Prone Shoulder Extension - Single Arm  - 1 x daily - 7 x weekly - 3 sets - 10 reps - Prone Shoulder Horizontal Abduction  - 1 x daily - 7 x weekly - 3 sets - 10 reps - Prone Single Arm Shoulder Y  - 1 x daily - 7 x weekly - 3 sets - 10 reps - Prone Shoulder Row  - 1 x daily - 7 x weekly - 2 sets - 10 reps - Prone Shoulder External Rotation  - 1 x daily - 7 x weekly - 3 sets - 10 reps - Cervical Retraction Prone on Elbows  - 1 x daily - 7 x weekly - 3 sets - 10 reps - Seated Scapular Clock (1 to 7)  - 1 x daily - 7 x weekly - 3 sets - 10 reps - Standing Wall [employer] with Mini Swiss Ball  - 1 x daily - 7 x weekly - 3 sets - 10 reps - Standing Low Trap Setting with Resistance at Wall  - 1 x daily - 7 x weekly - 3 sets - 10 reps - Wall Clock with Theraband  - 1 x daily - 7 x weekly - 3 sets - 10 reps   ASSESSMENT:  CLINICAL IMPRESSION: Based on assessment  pt will benefit from continued skilled therapy and per MD orders. Pt although improving with strength and ROM, still showing weakness with prolonged reaching and use of L shoulder which is required for his job. His VL through insurance is 30 visit per year, today is visit 24. I would suggest that he continue at a frequency of once per week for additional 6 weeks. He is written out of work as of now and did not have  specific time frame for return. Will also try to obtain orders for ionto from MD as this would be beneficial as well. He  would continue to benefit from skilled therapy to improve functional performance and decrease pain with work related activities to improve function.   EVAL:  William Howe is a 33 y.o. male who was referred to physical therapy for evaluation and treatment for L RTC tendonitis/L shoulder pain   Patient reports onset of R shoulder pain beginning 2/25 for no reason. Pain is worse with any lifting or reaching forward to cook, etc.  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  He has significantly weak periscapular musculature of 3+/5.    He still has L neck pain with positive Spurling's test as well as the positive RTC/impingement tests.   He is also quite hypermobile with passive ER/IR are well past 90 degrees.   Patient has deficits in cervical ROM, hyper flexibility, L shoulder strength, abnormal forward head posture, and TTP at L neck and shoulder which are interfering with ADLs and are impacting quality of life.  On QuickDASH patient scored 31.8/100 demonstrating 31.8% disability.  Elie will benefit from skilled PT to address above deficits to improve mobility and activity tolerance with decreased pain interference.  OBJECTIVE IMPAIRMENTS: decreased ROM, decreased strength, impaired UE functional use, postural dysfunction, and pain.   ACTIVITY LIMITATIONS: lifting, reach over head, and working at [employer]  PARTICIPATION LIMITATIONS: meal prep, cleaning, laundry, and occupation  PERSONAL FACTORS: Fitness and Time since onset of injury/illness/exacerbation are also affecting patient's functional outcome.   REHAB POTENTIAL: Good  CLINICAL DECISION MAKING: Evolving/moderate complexity  EVALUATION COMPLEXITY: Moderate   GOALS: Goals reviewed with patient? Yes  SHORT TERM GOALS: Target date: 05/28/24  Patient will report 0/10 pain in the L shoulder/neck Baseline: 2-3/10 on 04/30/24 while out of work  Goal status: IN  PROGRESS  2.  Patient to demonstrate improved upright posture with posterior shoulder girdle engaged to promote improved functional UE use. Baseline: forward head with all movements Goal status: IN PROGRESS- 04/30/24  LONG TERM GOALS: Target date: 06/25/2024  1.  Patient will demonstrate improved L shoulder strength to >/= 5/5 for functional UE use. Baseline: Refer to above UE MMT table Goal status: IN PROGRESS- 04/30/24  2.  Patient will report </= 50% on QuickDASH (MCID = 14%) overall more so to demonstrate improved functional ability.  Baseline: 31.8 04/30/24: 25% Goal status: IN PROGRESS  3.  Patient will be able to lift 5-20 lbs overhead repeatedly for 15-10 minutes to be able to work full shift at [employer] with minimal to  no pain Baseline: pain after 1-2 hrs of work Goal Status: INITIAL on 04/30/24  4.  Patient will demonstrate 5/5 strength in his R middle trapezius, R lower trapezius, and R infraspinatus/ext rotation after lifting  weight overhead x 5-10 minutes  Baseline:  3+/5 strength after repeated shoulder flexion x 5'  Goal Status:   INITIAL on 04/30/24 PLAN:  PT FREQUENCY: 1-2x/week  PT DURATION: 8 weeks  PLANNED INTERVENTIONS: 97164- PT Re-evaluation, 97750- Physical Performance Testing, 97110-Therapeutic exercises, 97530- Therapeutic activity, [id number]- Neuromuscular re-education, 97535- Self Care, 02859- Manual therapy, [id number]- Electrical stimulation (unattended), 97016- Vasopneumatic device, [id number]- Ultrasound, [id number]- Traction (mechanical), [id number]- Ionotophoresis 4mg /ml Dexamethasone , 79439 (1-2 muscles), 20561 (3+ muscles)- Dry Needling, Patient/Family education, Taping, Joint mobilization, Spinal mobilization, Cryotherapy, Moist heat, and Biofeedback  PLAN FOR  NEXT SESSION: work on [employer] co contraction activities with Fitter, wall/ball, quadruped for scapular stability/strengthening;  Do QuickDash  Miral Hoopes L Alberta Lenhard, PTA 04/30/2024, 3:27 PM  RECERTIFICATION NOTE: I was present in  the clinic for this visit.  Objective tests and measures were taken and goals were reviewed and updated.  Patient has been seen x 6 months total for PT.   Initially he was referred to us  for L neck pain from neurosurgery and received treatment exclusively for this problem.   HE made good improvement with his neck pain, but his L shoulder continued to be a problem.   He saw Dr Grimes/orthopedics 2-3 months ago and was dx'd with L RTC tendonitis.  He had some injections and continued PT, and his resting pain seems to have improved.   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   He would do very well on his days off and be painfree, but after several hours of working his R shoulder pain would return.   He pushed through it and continued to work as best he could but the pain did not resolve.   He had a R shoulder MRI in the last few weeks that did not show any RTC tearing as we had suspected that it might.   Dr Alexandra has written him out of work for a while and wants continued PT to see if he can strengthen his R shoulder enough that he can return to work painfree.   He has had profound weakness of the R middle and lower traps which is improving by 1 MMT muscle grade over initial shoulder evaluation 2 months ago.  However, after he does a lot of lifting in the clinic his middle traps, lower traps, and external rotators of the R shoulder fatigue and his strength is only 3+/5 in these muscle groups.   It is felt that further PT focusing on the endurance of these muscle groups will get him to a place where he can do prolonged overhead lifting and reaching and return to work painfree.   He is understandably quite frustrated at how long this process is taking given his young age and desire to return to normal work and normal life.   We will continue PT for another 1-2 months specifically for endurance training of the posterior RTC and trapezius/rhomboid musculature with lifting  activities with neuroreeducation for engaging these muscles specifically in functional tasks.   PT remains necessary for weakness, lack of muscular endurance, pain.  Continue per POC  St/ephen Kunkle, PT 04/30/24  [facility] [address]  [address] [place], [place], [address] Phone: [phone]   Fax:  [phone]

## 2024-05-08 ENCOUNTER — Encounter

## 2024-05-08 NOTE — Addendum Note (Signed)
 Addended by: Efraim Vanallen G on: 05/08/2024 09:23 AM   Modules accepted: Orders

## 2024-05-10 ENCOUNTER — Other Ambulatory Visit: Payer: Self-pay

## 2024-05-10 ENCOUNTER — Ambulatory Visit

## 2024-05-10 DIAGNOSIS — G8929 Other chronic pain: Secondary | ICD-10-CM

## 2024-05-10 DIAGNOSIS — M62838 Other muscle spasm: Secondary | ICD-10-CM

## 2024-05-10 DIAGNOSIS — M6281 Muscle weakness (generalized): Secondary | ICD-10-CM

## 2024-05-10 DIAGNOSIS — M25512 Pain in left shoulder: Secondary | ICD-10-CM | POA: Diagnosis not present

## 2024-05-10 DIAGNOSIS — R293 Abnormal posture: Secondary | ICD-10-CM

## 2024-05-10 NOTE — Therapy (Addendum)
 OUTPATIENT PHYSICAL THERAPY UPPER EXTREMITY TREATMENT / DC SUMMARY   Patient Name: William Howe MRN: [id number] DOB:1991/02/13, 33 y.o., male Today's Date: 05/10/2024  END OF SESSION:  PT End of Session - 05/10/24 1640     Visit Number 25    Date for Recertification  06/11/24    Authorization Type [employer]    PT Start Time 1449    PT Stop Time 1530    PT Time Calculation (min) 41 min    Activity Tolerance Patient tolerated treatment well    Behavior During Therapy [facility] for tasks assessed/performed              History reviewed. No pertinent past medical history. Past Surgical History:  Procedure Laterality Date   HAND SURGERY     TOE SURGERY     There are no active problems to display for this patient.   PCP: Jason Leita Repine, FNP   REFERRING PROVIDER: Alexandra Lonni LABOR, *  REFERRING DIAG:  [id number] (ICD-10-CM) - Pain in left shoulder    THERAPY DIAG:  Chronic left shoulder pain  Muscle weakness (generalized)  Abnormal posture  Acute pain of left shoulder  Other muscle spasm  RATIONALE FOR EVALUATION AND TREATMENT: Rehabilitation  ONSET DATE: 2/25   NEXT MD VISIT:     f/u PRN   SUBJECTIVE:                                                                                                                                                                                                         SUBJECTIVE STATEMENT:   I've been written out of work for a little bit so performing the exercises over 2 hour period, do a little then rest.  Still pain/fatigue after use, particularly overhead movements   EVAL:  Pt. Is a 33 y/o male referred to PT from Dr Alexandra for L RTC tendonitis/ L shoulder pain.  PT has been doing PT here with us  for the last couple of months.  He was initially referred to us  from Dr Colon for L neck pain.    States Pain started for no apparent reason in 2/25.  States Went ER then to neurosurgery, had cervical MRI showing mild disc  bulging.   Did well with PT here and had good results with his L upper trap/neck pain.   However, he is still having some L neck pain, but c/o more of L shoulder pain.   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   States  does a lot of heavy lifting and reaching when cooking which is hurting considerably.  He has a good HEP for neck stretching and has been doing this.   He is frustrated that his symptoms have not resolved in 5 months.  saw Dr Alexandra who feels he has some RTC pathology and requested further PT To address this issue.    Pt. States he received a L shoulder injection but doesn't feel it helped.  States pain is intermittent but worst with work.   Sitting and resting can relieve the pain completely at times, but almost always has a dull achy pain in the L anterior shoulder.  He sttaes the pain is from the lateral greater tuberosity area to anteriorly into the pec area.  However, he still does have some L sided neck pain.    PAIN: Are you having pain? Yes: NPRS scale: 1/10 now;  worst 7/10 Pain location: L shoulder anteriorly Pain description: dull always, sharp with lifting and reaching forward Aggravating factors: lifting, repeated reaching Relieving factors: sitting and resting  PERTINENT HISTORY:  noncontributory  PRECAUTIONS: None  HAND DOMINANCE: Right  RED FLAGS: None  WEIGHT BEARING RESTRICTIONS: No  FALLS:  Has patient fallen in last 6 months? 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 Left arm weakness. No known injury.   EXAM: MRI CERVICAL SPINE WITHOUT CONTRAST   TECHNIQUE: Multiplanar, multisequence MR imaging of the cervical spine was performed. No intravenous contrast was administered.   COMPARISON:  None Available.   FINDINGS: Alignment: Normal.   Vertebrae: No fracture, evidence of discitis, or bone lesion.   Cord: Normal signal throughout.   Posterior Fossa, vertebral arteries, paraspinal tissues: Negative.   Disc levels:   The central canal and foramina are widely patent at all levels. Disc height and hydration are maintained throughout. Minimal disc bulging C5-6 and C6-7 is noted.   IMPRESSION: Minimal disc bulging C5-6 and C6-7. The exam is otherwise negative. The central canal and foramina are widely patent at all levels.     Electronically Signed   By: Debby Prader M.D.   On: 12/06/2023 09:02  PATIENT SURVEYS:  Quick dash = 31.8  COGNITION: Overall cognitive status: Within functional limits for tasks assessed     SENSATION: WFL  POSTURE: Some rounded shoulder posture  CERVICAL ROM:   Active ROM A/PROM (deg) eval 04/05/24  Flexion 90 full  Extension 50 full  Right lateral flexion 75 full  Left lateral flexion 75 full  Right rotation 50 full  Left rotation 60 full   (Blank rows = not tested)   UPPER EXTREMITY ROM:   Active ROM Right eval Left eval L 04/05/24 L 04/30/24 standing  Shoulder flexion 170 170 150 standing 165 supine 165  Shoulder extension 50 50    Shoulder abduction 175 175 145 standing 164- supine 155  Shoulder adduction      Shoulder internal rotation To T10 T10 T7 T7  Shoulder external rotation C7 c7 T3 T3  Elbow flexion      Elbow extension      Wrist flexion      Wrist extension      Wrist ulnar deviation      Wrist radial deviation      Wrist pronation      Wrist supination      (Blank rows = not  tested)  Active ROM Right eval Left eval  Thumb MCP (0-60)    Thumb IP (0-80)    Thumb Radial abd/add (0-55)     Thumb Palmar abd/add (0-45)     Thumb Opposition to Small Finger     Index MCP (0-90)     Index PIP (0-100)     Index DIP (0-70)      Long MCP (0-90)      Long PIP (0-100)      Long DIP (0-70)      Ring MCP (0-90)      Ring PIP (0-100)      Ring DIP (0-70)      Little MCP (0-90)      Little PIP (0-100)      Little DIP (0-70)      (Blank rows = not tested)  UPPER EXTREMITY MMT:  MMT Right eval Left eval R 03/29/24 L 03/29/24 L 04/30/24  Shoulder flexion 4 4 5 5 5   Shoulder extension  4-     Shoulder abduction 4 4- 5 5 5   Shoulder adduction       Shoulder internal rotation 5 5 5 5 5   Shoulder external rotation 4+ 4 5 4+ 5  Middle trapezius  3+ 4+ 4- 4+  Lower trapezius  3+ 4+ 4- 4  Elbow flexion 5 5     Elbow extension 5 5     Wrist flexion 5 5     Wrist extension 5 5     Wrist ulnar deviation       Wrist radial deviation       Wrist pronation       Wrist supination       Grip strength (lbs) =BUE      (Blank rows = not tested)  SHOULDER SPECIAL TESTS: Impingement tests: Neer impingement test: positive , Hawkins/Kennedy impingement test: negative, and Painful arc test: negative SLAP lesions: Biceps load test: negative Instability tests: Apprehension test: negative and Sulcus sign: negative Rotator cuff assessment: Drop arm test: negative, Empty can test: positive , Full can test: positive , Gerber lift off test: negative, Infraspinatus test: negative, and Hornblower's sign: negative Biceps assessment: Yergason's test: negative and Speed's test: positive  A/C shear test is negative Scapular movement is difficult to assess due to large body habitus, but appears normal without winging, tipping Spurlings is negative to the R, but positive in neutral and to the L Cervical Distraction is negative  ULTT A and B are both negative LUE  JOINT MOBILITY TESTING:    Decreased posterior glide of the humerus, but otherwise feels hypermobile  PALPATION:  TTP over the L lateral cervical transverse processes and scalenes; L greater tuberosity around the anterior shoulder to subacromial space    TODAY'S TREATMENT:  05/10/24 Initially in quadriped with hands on fitter for 30 sec , side to side motion Quadriped for alt horizontal shoulder abduction with 3 # inst to maintain neutral spine/ trunk Quadriped alt shoulder taps Bodyblade for 30 sec with L UE with hand vertical Bouncing ball over shoulder height on wall 30 sec intervals Seated for B shoulder ER  with yellow t band , 30 sec intervals, fast pace, low amplitude Wall slides into flexion with pillow case around hands and forearms for sustained isometric shoulder horizontal abd through movement  Manual: supine: lateral glides humeral head while shoulder in Hawkin's Kennedy test position, multiple reps without pain to stretch post capsule A/P glides L humerus while performing L shoulder flexion in supine  Standing instructed in L posterior shoulder capsular stretch in door frame withL trunk rotation  Standing for L shoulder self A/P stabilization humeral head, back to door frame, moving L shoulder into flexion with this glide.  04/30/24 Assessed goals for recert: see below Serratus slide with RTB on wall x 10 Horiz abd YTB 2x10 Standing self humeral glides for pain relief 2x10  04/12/24 UBE L4 3'F/3'B  MANUAL THERAPY: To promote reduced pain utilizing joint mobilization. IASTM s/s tool to L long head of bicep followed by manual STM over bicep and anterior shoulder area.  NEUROMUSCULAR RE-EDUCATION: To improve posture and proprioception and co contraction. Prone on green pball:  I,T,Y,W 2x10 each way Upside down bosu plank position weight shifts side to side x 10 Upside down bosu plank position high knees x 10 started to irritate L bicep- discontinued  04/05/24  UBE L4 3'F/3'B Chest stretch in  doorway x 1'; 2 reps THERAPEUTIC ACTIVITIES:  Box lifts 20lb 2x10   NEUROMUSCULAR RE-EDUCATION: To improve posture. Standing scapular clocks RTB x 10 BUE Lower trap raises RTB  x 10 Low horiz ABD RTB 5x5 Standing B ER RTB 2x10 + scap retraction Standing B horiz ABD RTB 2x10 + scap retraction Seated thoracic ext in chair 5x5  MANUAL THERAPY: To promote normalized muscle tension, improve joint mobility and/or for pain modulation  L shoulder posterior glides to improve ROM grade 3-4\ KT tape for RTC: I strip from coracoid, along supraspinatus muscle to medial border of scapula I strip from middle deltoid to mid medial border of scapula 04/02/24  UBE L3 3'F/3'B Chest stretch in doorway x 1' THERAPEUTIC ACTIVITIES:  Seated rows 35lb 2x10 mid rows Seated lat pulls 35lb 2x10 BLE  NEUROMUSCULAR RE-EDUCATION: To improve posture. Standing posterior humeral glides GTB w/ shoulder flexion, abd, ER at 90 deg S/L L shoulder abduction 3lb 2x10  S/L L shoulder horiz ABD 3lb 2x10  S/L L shoulder ER 3lb 2x10  Wall push ups w/ pball x 20 PATIENT EDUCATION:  Education details: co contraction for scapular stabilization when elevating the LUE  Person educated: Patient Education method: Explanation, Demonstration, Verbal cues, Tactile cues, and MedBridgeGO app access provided Education comprehension: verbalized understanding, verbal cues required, tactile cues required, and needs further education  HOME EXERCISE PROGRAM: Access Code: [email or web address] URL: [email or web address] Date: 03/29/2024 Prepared by: Braylin Clark  Exercises - Prone Shoulder Extension - Single Arm  - 1 x daily - 7 x weekly - 3 sets - 10 reps - Prone Shoulder Horizontal Abduction  - 1 x daily - 7 x weekly - 3 sets - 10 reps - Prone Single Arm Shoulder Y  - 1 x daily - 7 x weekly - 3 sets - 10 reps - Prone Shoulder Row  - 1 x daily - 7 x weekly - 2 sets - 10 reps - Prone Shoulder External Rotation  - 1 x daily - 7  x weekly - 3 sets - 10 reps - Cervical Retraction Prone on Elbows  - 1 x daily - 7 x weekly - 3 sets - 10 reps - Seated Scapular Clock (1 to 7)  -  1 x daily - 7 x weekly - 3 sets - 10 reps - Standing Wall [employer] with Mini Swiss Ball  - 1 x daily - 7 x weekly - 3 sets - 10 reps - Standing Low Trap Setting with Resistance at Wall  - 1 x daily - 7 x weekly - 3 sets - 10 reps - Wall Clock with Theraband  - 1 x daily - 7 x weekly - 3 sets - 10 reps   ASSESSMENT:  CLINICAL IMPRESSION:05/10/24:  Pt was instructed in various activities / ex designed to improve centralization of L humeral head and additionally self mobilization techniques to reduce L shoulder irritations.  Adapted some of his current routine based on his improved strength, focusing  on control and endurance. He tolerated well.  Sore with some of the exercises.   Based on assessment  pt will Howe from continued skilled therapy and per MD orders. Pt although improving with strength and ROM, still showing weakness with prolonged reaching and use of L shoulder which is required for his job. His VL through insurance is 30 visit per year, today is visit 24. I would suggest that he continue at a frequency of once per week for additional 6 weeks. He is written out of work as of now and did not have specific time frame for return. Will also try to obtain orders for ionto from MD as this would be beneficial as well. He would continue to Howe from skilled therapy to improve functional performance and decrease pain with work related activities to improve function.   EVAL:  William Howe is a 33 y.o. male who was referred to physical therapy for evaluation and treatment for L RTC tendonitis/L shoulder pain   Patient reports onset of R shoulder pain beginning 2/25 for no reason. Pain is worse with any lifting or reaching forward to cook, etc.  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  He has significantly weak periscapular musculature of 3+/5.    He still has L neck pain with positive Spurling's test as well as the positive RTC/impingement tests.   He is also quite hypermobile with passive ER/IR are well past 90 degrees.   Patient has deficits in cervical ROM, hyper flexibility, L shoulder strength, abnormal forward head posture, and TTP at L neck and shoulder which are interfering with ADLs and are impacting quality of life.  On QuickDASH patient scored 31.8/100 demonstrating 31.8% disability.  William Howe from skilled PT to address above deficits to improve mobility and activity tolerance with decreased pain interference.  OBJECTIVE IMPAIRMENTS: decreased ROM, decreased strength, impaired UE functional use, postural dysfunction, and pain.   ACTIVITY LIMITATIONS: lifting, reach over head, and working at [employer]  PARTICIPATION LIMITATIONS: meal prep, cleaning, laundry, and occupation  PERSONAL FACTORS: Fitness and Time since onset of injury/illness/exacerbation are also affecting patient's functional outcome.   REHAB POTENTIAL: Good  CLINICAL DECISION MAKING: Evolving/moderate complexity  EVALUATION COMPLEXITY: Moderate   GOALS: Goals reviewed with patient? Yes  SHORT TERM GOALS: Target date: 05/28/24  Patient will report 0/10 pain in the L shoulder/neck Baseline: 2-3/10 on 04/30/24 while out of work  Goal status: IN PROGRESS  2.  Patient to demonstrate improved upright posture with posterior shoulder girdle engaged to promote improved functional UE use. Baseline: forward head with all movements Goal status: IN PROGRESS- 04/30/24  LONG TERM GOALS:  Target date: 06/25/2024  1.  Patient will demonstrate improved L shoulder strength to >/= 5/5 for functional UE use. Baseline: Refer to above UE MMT table Goal status: IN PROGRESS- 04/30/24  2.  Patient will report </= 50% on QuickDASH (MCID = 14%) overall more so to demonstrate improved functional  ability.  Baseline: 31.8 04/30/24: 25% Goal status: IN PROGRESS  3.  Patient will be able to lift 5-20 lbs overhead repeatedly for 15-10 minutes to be able to work full shift at [employer] with minimal to  no pain Baseline: pain after 1-2 hrs of work Goal Status: INITIAL on 04/30/24  4.  Patient will demonstrate 5/5 strength in his R middle trapezius, R lower trapezius, and R infraspinatus/ext rotation after lifting  weight overhead x 5-10 minutes  Baseline:  3+/5 strength after repeated shoulder flexion x 5'  Goal Status:   INITIAL on 04/30/24 PLAN:  PT FREQUENCY: 1-2x/week  PT DURATION: 8 weeks  PLANNED INTERVENTIONS: 97164- PT Re-evaluation, 97750- Physical Performance Testing, 97110-Therapeutic exercises, 97530- Therapeutic activity, [id number]- Neuromuscular re-education, 97535- Self Care, 02859- Manual therapy, G0283- Electrical stimulation (unattended), 97016- Vasopneumatic device, [id number]- Ultrasound, [id number]- Traction (mechanical), [id number]- Ionotophoresis 4mg /ml Dexamethasone , 79439 (1-2 muscles), 20561 (3+ muscles)- Dry Needling, Patient/Family education, Taping, Joint mobilization, Spinal mobilization, Cryotherapy, Moist heat, and Biofeedback  PLAN FOR NEXT SESSION: work on [employer] co contraction activities with Fitter, wall/ball, quadruped for scapular stability/strengthening;  Do QuickDash  William Howe L Kadiatou Oplinger, PT, DPT, OCS 05/10/2024, 4:46 PM   PHYSICAL THERAPY DISCHARGE SUMMARY  Visits from Start of Care: 25  Current functional level related to goals / functional outcomes: Received message from patient/provider that provider requested D/C PT at this time   Remaining deficits: As outlined above in the last note   Education / Equipment: Patient is independent with all home exercises and advised to continue daily as tolerated and call us  with any questions   Patient agrees to discharge. Patient goals were not met. Patient is being discharged due to the physician's request.   St/ephen Red,  PT 05/24/24  [facility] [address]  [address], [place], [address] Phone: [phone]   Fax:  [phone]

## 2024-05-11 NOTE — Telephone Encounter (Signed)
 [employer] is calling in, states that they have faxed over disability paperwork but have not received response, asking for update   [employer] [phone]- Charmaine

## 2024-05-14 ENCOUNTER — Encounter: Admitting: Rehabilitation

## 2024-05-17 ENCOUNTER — Encounter: Admitting: Rehabilitation

## 2024-05-21 ENCOUNTER — Ambulatory Visit: Admitting: Rehabilitation

## 2024-06-01 ENCOUNTER — Encounter (HOSPITAL_BASED_OUTPATIENT_CLINIC_OR_DEPARTMENT_OTHER): Payer: Self-pay

## 2024-06-01 ENCOUNTER — Other Ambulatory Visit: Payer: Self-pay

## 2024-06-01 ENCOUNTER — Emergency Department (HOSPITAL_BASED_OUTPATIENT_CLINIC_OR_DEPARTMENT_OTHER)
Admission: EM | Admit: 2024-06-01 | Discharge: 2024-06-01 | Disposition: A | Discharge status: Home / Self Care | Attending: Emergency Medicine | Admitting: Emergency Medicine

## 2024-06-01 DIAGNOSIS — S46912A Strain of unspecified muscle, fascia and tendon at shoulder and upper arm level, left arm, initial encounter: Secondary | ICD-10-CM | POA: Insufficient documentation

## 2024-06-01 DIAGNOSIS — Y990 Civilian activity done for income or pay: Secondary | ICD-10-CM | POA: Insufficient documentation

## 2024-06-01 DIAGNOSIS — X509XXA Other and unspecified overexertion or strenuous movements or postures, initial encounter: Secondary | ICD-10-CM | POA: Insufficient documentation

## 2024-06-01 DIAGNOSIS — S4992XA Unspecified injury of left shoulder and upper arm, initial encounter: Secondary | ICD-10-CM | POA: Diagnosis present

## 2024-06-01 MED ORDER — CYCLOBENZAPRINE HCL 5 MG PO TABS
5.0000 mg | ORAL_TABLET | Freq: Once | ORAL | Status: AC
Start: 2024-06-01 — End: 2024-06-01
  Administered 2024-06-01: 5 mg via ORAL
  Filled 2024-06-01: qty 1

## 2024-06-01 MED ORDER — CYCLOBENZAPRINE HCL 5 MG PO TABS: 5.0000 mg | ORAL_TABLET | Freq: Three times a day (TID) | ORAL | 0 refills | Status: AC | PRN

## 2024-06-01 NOTE — ED Triage Notes (Signed)
 Pt to ED for worsening L shoulder pain and tenderness. Pt has hx of shoulder pain since February. Follows up with specialists, PT, steroid shots, with no relief. Pt states he is standing and moving around a lot at work, puts pressure on his back and shoulders. Denies numbness/tingling/swelling. Took prescribed pain meds pta. Ran out of muscle relaxers. States that the muscle relaxers did help.

## 2024-06-01 NOTE — ED Provider Notes (Signed)
 [place] [facility] Provider Note   CSN: [id number] Arrival date & time: 06/01/24  0109     Patient presents with: Shoulder Pain   William Howe is a 33 y.o. male.   The history is provided by the patient and a parent.   Patient presents for recurrent left shoulder pain.  Patient reports due to his job working at [employer] it appears to worsen his symptoms.  He has had ongoing left shoulder pain for several months with multiple evaluations, has had MRI of the shoulder as well as injections.  This pain feels similar to prior.  No falls or trauma.  At times the pain does appear to go down to his chest but is mostly localized in his shoulder. He plans to follow-up with his orthopedist    Prior to Admission medications   Medication Sig Start Date End Date Taking? Authorizing Provider  cyclobenzaprine  (FLEXERIL ) 5 MG tablet Take 1 tablet (5 mg total) by mouth 3 (three) times daily as needed for muscle spasms. 06/01/24  Yes Midge Golas, MD  ibuprofen  (ADVIL ,MOTRIN ) 200 MG tablet Take 600 mg by mouth every 6 (six) hours as needed. For pain    [provider]  lidocaine  (LIDODERM ) 5 % Place 1 patch onto the skin daily. Remove & Discard patch within 12 hours or as directed by MD 10/08/23   Carita Senior, MD  meloxicam  (MOBIC ) 7.5 MG tablet Take 1 tablet (7.5 mg total) by mouth 2 (two) times daily. 04/14/24   Mesner, Selinda, MD    Allergies: Patient has no known allergies.    Review of Systems  Constitutional:  Negative for fever.  Gastrointestinal:  Negative for vomiting.  Musculoskeletal:  Positive for arthralgias.    Updated Vital Signs BP (!) 136/94 (BP Location: Right Arm)   Pulse 79   Temp 97.7 F (36.5 C) (Oral)   Resp 16   Ht 1.88 m (6' 2)   Wt 122.5 kg   SpO2 100%   BMI 34.67 kg/m   Physical Exam CONSTITUTIONAL: Well developed/well nourished HEAD: Normocephalic/atraumatic SPINE/BACK:entire spine nontender NEURO: Pt  is awake/alert/appropriate, moves all extremitiesx4.  No facial droop.   EXTREMITIES: pulses normal/equal, full ROM Tenderness noted over the left anterior shoulder, but no erythema, no bruising, no deformity He can lift his left upper extremity but limited due to pain. Distal pulses equal and intact. SKIN: warm, color normal PSYCH: no abnormalities of mood noted, alert and oriented to situation  (all labs ordered are listed, but only abnormal results are displayed) Labs Reviewed - No data to display  EKG: EKG Interpretation Date/Time:  Friday June 01 2024 02:28:48 EDT Ventricular Rate:  70 PR Interval:  190 QRS Duration:  95 QT Interval:  399 QTC Calculation: 431 R Axis:   56  Text Interpretation: Sinus rhythm Confirmed by Midge Golas ([id number]) on 06/01/2024 2:43:21 AM  Radiology: No results found.   Procedures   Medications Ordered in the ED  cyclobenzaprine  (FLEXERIL ) tablet 5 mg (5 mg Oral Given 06/01/24 0224)                                    Medical Decision Making Amount and/or Complexity of Data Reviewed ECG/medicine tests: ordered.  Risk Prescription drug management.   Patient presents for ongoing shoulder pain that was worsened while working.  He has already been evaluated multiple times, has had PT, had injections  as well as Ortho follow-up.  No indication for emergent workup at this time.  I did add on a EKG due to his reports having pain at times in his chest, this is unremarkable.  Patient reports good relief with Flexeril  in the past, will give short course and refer back to his orthopedist     Final diagnoses:  Strain of left shoulder, initial encounter    ED Discharge Orders          Ordered    cyclobenzaprine  (FLEXERIL ) 5 MG tablet  3 times daily PRN        06/01/24 0215               Midge Golas, MD 06/01/24 [phone]

## 2024-06-01 NOTE — ED Notes (Signed)
 Patient states taking Tylenol  / Mobic . Pt states muscle relaxants helps however been out for 1 month. Pt states will be calling to make appt [facility].

## 2024-06-09 ENCOUNTER — Emergency Department (HOSPITAL_BASED_OUTPATIENT_CLINIC_OR_DEPARTMENT_OTHER)
Admission: EM | Admit: 2024-06-09 | Discharge: 2024-06-10 | Disposition: A | Discharge status: Home / Self Care | Attending: Emergency Medicine | Admitting: Emergency Medicine

## 2024-06-09 ENCOUNTER — Other Ambulatory Visit: Payer: Self-pay

## 2024-06-09 ENCOUNTER — Encounter (HOSPITAL_BASED_OUTPATIENT_CLINIC_OR_DEPARTMENT_OTHER): Payer: Self-pay

## 2024-06-09 DIAGNOSIS — R42 Dizziness and giddiness: Secondary | ICD-10-CM | POA: Diagnosis not present

## 2024-06-09 DIAGNOSIS — R110 Nausea: Secondary | ICD-10-CM | POA: Insufficient documentation

## 2024-06-09 LAB — RESP PANEL BY RT-PCR (RSV, FLU A&B, COVID)  RVPGX2
Influenza A by PCR: NEGATIVE
Influenza B by PCR: NEGATIVE
Resp Syncytial Virus by PCR: NEGATIVE
SARS Coronavirus 2 by RT PCR: NEGATIVE

## 2024-06-09 LAB — GROUP A STREP BY PCR: Group A Strep by PCR: NOT DETECTED

## 2024-06-09 NOTE — ED Triage Notes (Signed)
 Pt reports nausea, dizziness, and sore throat starting today. Denies abdominal pain, vomiting, diarrhea. Reports feeling chills, but no temperature taken.

## 2024-06-10 MED ORDER — ONDANSETRON 4 MG PO TBDP
8.0000 mg | ORAL_TABLET | Freq: Once | ORAL | Status: AC
  Administered 2024-06-10: 8 mg via ORAL
  Filled 2024-06-10: qty 2

## 2024-06-10 MED ORDER — ONDANSETRON 4 MG PO TBDP: 4.0000 mg | ORAL_TABLET | Freq: Three times a day (TID) | ORAL | 0 refills | Status: AC | PRN

## 2024-06-10 NOTE — ED Notes (Signed)
 Pt preformed nasal rinse with atomizers and flush technique, same went well pt notes feeling better.  Pt medicated at this time.

## 2024-06-10 NOTE — ED Notes (Signed)
 ED Provider at bedside.

## 2024-06-10 NOTE — ED Provider Notes (Signed)
 [place] [facility] AT [facility] Provider Note   CSN: [id number] Arrival date & time: 06/09/24  2241     Patient presents with: Nausea and Dizziness   William Howe is a 33 y.o. male.   presenting with symptoms that began on Thursday afternoon while at work, characterized by frequent sneezing, approximately ten to twenty times, initially thought to be due to allergies. By the following day, the patient experienced a scratchy throat and took over-the-counter cold medications, including Alka Seltzer, Tylenol , cough suppressants, nasal decongestants, and antihistamines. Despite these measures, the patient reports persistent dizziness, lightheadedness, and chills, which worsened during a work shift today. The sore throat has progressed from scratchy to more painful. The patient also experienced nausea while eating but has not vomited. There is a history of allergies, typically managed with a medication regimen, but no recent use of nasal sprays. The patient denies any significant pain but notes discomfort in the nose. No recent fever has been documented, and there are no known sick contacts at home. History was obtained from the patient.   Dizziness      Prior to Admission medications   Medication Sig Start Date End Date Taking? Authorizing Provider  ondansetron (ZOFRAN-ODT) 4 MG disintegrating tablet Take 1 tablet (4 mg total) by mouth every 8 (eight) hours as needed for vomiting. 06/10/24  Yes Edmund Rick, Selinda, MD  cyclobenzaprine  (FLEXERIL ) 5 MG tablet Take 1 tablet (5 mg total) by mouth 3 (three) times daily as needed for muscle spasms. 06/01/24   Midge Golas, MD  ibuprofen  (ADVIL ,MOTRIN ) 200 MG tablet Take 600 mg by mouth every 6 (six) hours as needed. For pain    [provider]  lidocaine  (LIDODERM ) 5 % Place 1 patch onto the skin daily. Remove & Discard patch within 12 hours or as directed by MD 10/08/23   Carita Senior, MD  meloxicam  (MOBIC )  7.5 MG tablet Take 1 tablet (7.5 mg total) by mouth 2 (two) times daily. 04/14/24   Ervey Fallin, Selinda, MD    Allergies: Patient has no known allergies.    Review of Systems  Neurological:  Positive for dizziness.    Updated Vital Signs BP (!) 148/89 (BP Location: Right Arm)   Pulse 84   Temp 97.6 F (36.4 C) (Oral)   Resp 20   Ht 6' 3 (1.905 m)   Wt 122.5 kg   SpO2 99%   BMI 33.75 kg/m   Physical Exam Vitals and nursing note reviewed.  Constitutional:      Appearance: He is well-developed.  HENT:     Head: Normocephalic and atraumatic.  Eyes:     Pupils: Pupils are equal, round, and reactive to light.  Cardiovascular:     Rate and Rhythm: Normal rate.  Pulmonary:     Effort: Pulmonary effort is normal. No respiratory distress.  Abdominal:     General: There is no distension.  Musculoskeletal:        General: Normal range of motion.     Cervical back: Normal range of motion.  Skin:    General: Skin is warm and dry.  Neurological:     General: No focal deficit present.     Mental Status: He is alert.     (all labs ordered are listed, but only abnormal results are displayed) Labs Reviewed  GROUP A STREP BY PCR  RESP PANEL BY RT-PCR (RSV, FLU A&B, COVID)  [id number]    EKG: None  Radiology: No results found.   Procedures  Medications Ordered in the ED  ondansetron (ZOFRAN-ODT) disintegrating tablet 8 mg (8 mg Oral Given 06/10/24 0045)                                    Medical Decision Making Risk Prescription drug management.   Patient with nausea and dizziness likely related to decreased intake, sweating and dehydration.  Was a bit tachycardic on arrival which improved without fluids.  Given Zofran.  Tolerating p.o.  Will continue Zofran at home.  Low suspicion for stroke, cardiac or other more emergent etiologies for her symptoms.   Final diagnoses:  Nausea  Dizziness    ED Discharge Orders          Ordered    ondansetron (ZOFRAN-ODT) 4 MG  disintegrating tablet  Every 8 hours PRN        06/10/24 0200               Chonita Gadea, Selinda, MD 06/10/24 [phone]

## 2024-06-27 ENCOUNTER — Encounter (HOSPITAL_BASED_OUTPATIENT_CLINIC_OR_DEPARTMENT_OTHER): Payer: Self-pay | Admitting: Emergency Medicine

## 2024-06-27 ENCOUNTER — Other Ambulatory Visit: Payer: Self-pay

## 2024-06-27 ENCOUNTER — Emergency Department (HOSPITAL_BASED_OUTPATIENT_CLINIC_OR_DEPARTMENT_OTHER)
Admission: EM | Admit: 2024-06-27 | Discharge: 2024-06-28 | Disposition: A | Attending: Emergency Medicine | Admitting: Emergency Medicine

## 2024-06-27 ENCOUNTER — Emergency Department (HOSPITAL_BASED_OUTPATIENT_CLINIC_OR_DEPARTMENT_OTHER)

## 2024-06-27 DIAGNOSIS — D72829 Elevated white blood cell count, unspecified: Secondary | ICD-10-CM | POA: Diagnosis not present

## 2024-06-27 DIAGNOSIS — R0789 Other chest pain: Secondary | ICD-10-CM | POA: Diagnosis present

## 2024-06-27 LAB — CBC WITH DIFFERENTIAL/PLATELET
Abs Immature Granulocytes: 0.05 K/uL (ref 0.00–0.07)
Basophils Absolute: 0.1 K/uL (ref 0.0–0.1)
Basophils Relative: 0 %
Eosinophils Absolute: 0 K/uL (ref 0.0–0.5)
Eosinophils Relative: 0 %
HCT: 43.5 % (ref 39.0–52.0)
Hemoglobin: 14.5 g/dL (ref 13.0–17.0)
Immature Granulocytes: 0 %
Lymphocytes Relative: 15 %
Lymphs Abs: 1.7 K/uL (ref 0.7–4.0)
MCH: 28.3 pg (ref 26.0–34.0)
MCHC: 33.3 g/dL (ref 30.0–36.0)
MCV: 84.8 fL (ref 80.0–100.0)
Monocytes Absolute: 0.8 K/uL (ref 0.1–1.0)
Monocytes Relative: 7 %
Neutro Abs: 9 K/uL — ABNORMAL HIGH (ref 1.7–7.7)
Neutrophils Relative %: 78 %
Platelets: 355 K/uL (ref 150–400)
RBC: 5.13 MIL/uL (ref 4.22–5.81)
RDW: 13.7 % (ref 11.5–15.5)
WBC: 11.6 K/uL — ABNORMAL HIGH (ref 4.0–10.5)
nRBC: 0 % (ref 0.0–0.2)

## 2024-06-27 MED ORDER — MORPHINE SULFATE (PF) 4 MG/ML IV SOLN
4.0000 mg | Freq: Once | INTRAVENOUS | Status: AC
  Administered 2024-06-27: 4 mg via INTRAVENOUS
  Filled 2024-06-27: qty 1

## 2024-06-27 NOTE — ED Provider Notes (Signed)
 [place] [facility] AT [facility] Provider Note   CSN: [id number] Arrival date & time: 06/27/24  2225     Patient presents with: Chest Pain   William Howe is a 33 y.o. male.  {Add pertinent medical, surgical, social history, OB history to YEP:67052} The history is provided by the patient.  Chest Pain  He has been treated for left shoulder pain for the last several months and comes in because of left-sided chest pain.  He noted a sharp pain in the left chest which seem to be worse with movement.  Pain has subsided and is now a dull ache and throbbing pain.  There was associated dyspnea and nausea but no diaphoresis.  He is a non-smoker and denies history of hypertension or diabetes or hyperlipidemia and there is no history of premature coronary atherosclerosis in first-degree relatives.  He had taken ibuprofen  and meloxicam  earlier tonight.  He is currently seeing an orthopedic doctor and is received injections in his left shoulder without any relief of that pain.    Prior to Admission medications   Medication Sig Start Date End Date Taking? Authorizing Provider  cyclobenzaprine  (FLEXERIL ) 5 MG tablet Take 1 tablet (5 mg total) by mouth 3 (three) times daily as needed for muscle spasms. 06/01/24   Midge Golas, MD  ibuprofen  (ADVIL ,MOTRIN ) 200 MG tablet Take 600 mg by mouth every 6 (six) hours as needed. For pain    [provider]  lidocaine  (LIDODERM ) 5 % Place 1 patch onto the skin daily. Remove & Discard patch within 12 hours or as directed by MD 10/08/23   Carita Senior, MD  meloxicam  (MOBIC ) 7.5 MG tablet Take 1 tablet (7.5 mg total) by mouth 2 (two) times daily. 04/14/24   Mesner, Jason, MD  ondansetron (ZOFRAN-ODT) 4 MG disintegrating tablet Take 1 tablet (4 mg total) by mouth every 8 (eight) hours as needed for vomiting. 06/10/24   Mesner, Selinda, MD    Allergies: Patient has no known allergies.    Review of Systems  Cardiovascular:   Positive for chest pain.  All other systems reviewed and are negative.   Updated Vital Signs BP (!) 147/100 (BP Location: Right Arm)   Pulse 89   Temp 97.7 F (36.5 C)   Resp 18   Ht 6' 3 (1.905 m)   Wt 122.5 kg   SpO2 95%   BMI 33.75 kg/m   Physical Exam Vitals and nursing note reviewed.   33 year old male, resting comfortably and in no acute distress. Vital signs are significant for elevated blood pressure. Oxygen saturation is 95%, which is normal. Head is normocephalic and atraumatic. PERRLA, EOMI.  Lungs are clear without rales, wheezes, or rhonchi. Chest is tender diffusely across the left anterior chest wall.  There is no crepitus and no point tenderness. Heart has regular rate and rhythm without murmur. Abdomen is soft, flat, nontender. Extremities have no cyanosis or edema.  There is pain on passive range of motion of the left shoulder. Skin is warm and dry without rash. Neurologic: Awake and alert.  (all labs ordered are listed, but only abnormal results are displayed) Labs Reviewed - No data to display  EKG: EKG Interpretation Date/Time:  Wednesday June 27 2024 22:37:45 EST Ventricular Rate:  85 PR Interval:  169 QRS Duration:  94 QT Interval:  354 QTC Calculation: 421 R Axis:   59  Text Interpretation: Sinus rhythm Confirmed by Mannie Pac [phone]) on 06/27/2024 10:40:00 PM  Radiology: No  results found.  {Document cardiac monitor, telemetry assessment procedure when appropriate:32947} Procedures   Medications Ordered in the ED - No data to display    {Click here for ABCD2, HEART and other calculators REFRESH Note before signing:1}                              Medical Decision Making  Left-sided chest pain.  This a presentation with a wide range of treatment options and carries with it a high risk of morbidity and complications.  Differential diagnosis includes, but is not limited to, musculoskeletal pain, ACS, pericarditis, pulmonary  embolism.  Exam is most consistent with musculoskeletal pain.  He has already received maximum doses of NSAIDs prior to coming in tonight.  I have ordered a dose of morphine for pain.  I have reviewed his electrocardiogram, and my interpretation is normal ECG.  I have ordered screening labs including D-dimer and chest x-ray.  I suspect his pain may be related to his shoulder pain.  I have reviewed his past records and note orthopedic office visit with steroid injection, MRI of the left shoulder showing tenosynovitis within the bicipital sheath.  {Document critical care time when appropriate  Document review of labs and clinical decision tools ie CHADS2VASC2, etc  Document your independent review of radiology images and any outside records  Document your discussion with family members, caretakers and with consultants  Document social determinants of health affecting pt's care  Document your decision making why or why not admission, treatments were needed:32947:::1}   Final diagnoses:  None    ED Discharge Orders     None

## 2024-06-27 NOTE — ED Triage Notes (Signed)
 Pt c/o LT CP that started one hour ago; describes as feels like a knife; has been having LT shoulder pain for some time (dx with tendonitis)

## 2024-06-28 LAB — BASIC METABOLIC PANEL WITH GFR
Anion gap: 13 (ref 5–15)
BUN: 23 mg/dL — ABNORMAL HIGH (ref 6–20)
CO2: 25 mmol/L (ref 22–32)
Calcium: 9.2 mg/dL (ref 8.9–10.3)
Chloride: 102 mmol/L (ref 98–111)
Creatinine, Ser: 0.93 mg/dL (ref 0.61–1.24)
GFR, Estimated: >60 mL/min (ref >60)
Glucose, Bld: 94 mg/dL (ref 70–99)
Potassium: 4.3 mmol/L (ref 3.5–5.1)
Sodium: 139 mmol/L (ref 135–145)

## 2024-06-28 LAB — D-DIMER, QUANTITATIVE: D-Dimer, Quant: 0.28 ug{FEU}/mL (ref 0.00–0.50)

## 2024-06-28 LAB — TROPONIN T, HIGH SENSITIVITY
Troponin T High Sensitivity: <15 ng/L (ref 0–19)
Troponin T High Sensitivity: <15 ng/L (ref 0–19)

## 2024-06-28 MED ORDER — NAPROXEN 500 MG PO TABS: 500.0000 mg | ORAL_TABLET | Freq: Two times a day (BID) | ORAL | 0 refills | Status: AC

## 2024-06-28 NOTE — Discharge Instructions (Addendum)
 You may apply ice as needed.  Ice can be applied for 30 minutes at a time, 4 times a day.  You may use lidocaine  patches as needed.  You may take acetaminophen  as needed.  Acetaminophen  works on pain in a different way than naproxen , and, when combined, give you better pain relief and you get from taking other medication by itself.  While you are taking naproxen , do not take ibuprofen  or meloxicam .

## 2024-09-01 NOTE — Therapy (Signed)
 " OUTPATIENT PHYSICAL THERAPY SHOULDER EVALUATION   Patient Name: William Howe MRN: [id number] DOB:1990/08/25, 34 y.o., male Today's Date: 09/06/2024   END OF SESSION:  PT End of Session - 09/06/24 1541     Visit Number 1    Date for Recertification  06/11/24    Authorization Type [employer]    PT Start Time 1535    PT Stop Time 1632    PT Time Calculation (min) 57 min    Activity Tolerance Patient tolerated treatment well    Behavior During Therapy [facility] for tasks assessed/performed          History reviewed. No pertinent past medical history. Past Surgical History:  Procedure Laterality Date   HAND SURGERY     TOE SURGERY     There are no active problems to display for this patient.   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   William Howe is well known to us  from recent episodes of PT for L neck pain and L shoulder pain.  Unfortunately his L shoulder pain failed to improve with PT and he went to Dr Case for a second opinion.   He has had multiple steroid injections that did not provide any relief or reveal a clear source of his pain.  His surgery revealed extensive synovial adhesion and scarring between the rotator cuff and the biceps tendon.   He also had diffuse subacromial bursitis which was  debrided from the subacromial periosteum.   He is 2 days post op and has already f/u with Dr Case in the office yesterday.  He is to wear his sling at all times.   We will be following the [place] Bicep tenodesis protocol which is available online.     PAIN: Are you having pain? Yes: NPRS scale: 4/10 now;  8/10 worst Pain location: L shoulder Pain description: aching constantly;  sharper at times  Aggravating factors: *** Relieving factors: rest, pain meds  PERTINENT HISTORY:  ***  PRECAUTIONS: {Therapy precautions:24002}  RED FLAGS: {PT Red Flags:29287}  HAND DOMINANCE: {Hand Dominance:29389}  WEIGHT BEARING RESTRICTIONS: {Yes ***/No:24003}  FALLS:  Has patient fallen in last 6 months? {fallsyesno:27318}  LIVING ENVIRONMENT: Lives with: {[facility] lives with:25569::lives with their family} Lives in: {Lives in:25570} Stairs: {opstairs:27293} Has following equipment at home: {Assistive devices:23999}  OCCUPATION: ***  PLOF: {PLOF:24004}  PATIENT GOALS: ***   OBJECTIVE: (objective measures completed at initial evaluation unless otherwise dated)  DIAGNOSTIC FINDINGS:  ***  PATIENT SURVEYS:  {rehab surveys:24030:a}  COGNITION: Overall cognitive status: {cognition:24006}     SENSATION: {sensation:27233}  POSTURE: {posture:25561}  UPPER EXTREMITY ROM:   {AROM/PROM:27142} ROM Right eval Left eval  Shoulder flexion    Shoulder extension    Shoulder abduction    Shoulder adduction    Shoulder internal rotation    Shoulder external rotation    Elbow flexion    Elbow extension    Wrist flexion    Wrist extension    Wrist ulnar deviation    Wrist radial deviation    Wrist pronation    Wrist supination    (Blank rows = not tested)  UPPER EXTREMITY MMT:  MMT Right eval Left eval  Shoulder flexion    Shoulder extension    Shoulder abduction    Shoulder adduction    Shoulder internal rotation    Shoulder external rotation    Middle trapezius    Lower  trapezius    Elbow flexion    Elbow extension    Wrist flexion    Wrist extension    Wrist ulnar deviation    Wrist radial deviation    Wrist pronation    Wrist supination    Grip strength (lbs)    (Blank rows = not tested)  SHOULDER SPECIAL TESTS: Impingement tests: {shoulder impingement test:25231:a} SLAP lesions: {SLAP lesions:25232} Instability tests: {shoulder instability test:25233} Rotator cuff assessment: {rotator cuff assessment:25234} Biceps assessment: {biceps assessment:25235}  JOINT MOBILITY TESTING:  ***  PALPATION:  ***    TODAY'S TREATMENT:  09/06/24 SELF CARE: Provided education on PT POC progression, on post-surgical precautions, and on pain management options.; initial HEP   PATIENT EDUCATION:  Education details: PT eval findings, anticipated POC, and initial HEP  Person educated: Patient Education method: Explanation, Demonstration, Verbal cues, Tactile cues, Handouts, and MedBridgeGO app access provided Education comprehension: verbalized understanding, returned demonstration, verbal cues required, tactile cues required, and needs further education  HOME EXERCISE PROGRAM: Access Code: [email or web address] URL: [email or web address] Date: 09/06/2024 Prepared by: Garnette Montclair  Exercises - Seated Shoulder Shrugs  - 1 x daily - 7 x weekly - 2 sets - 10 reps - Seated Shoulder Rolls  - 1 x daily - 7 x weekly - 2 sets - 10 reps - Ball Squeeze With Shoulder Sling  - 1 x daily - 7 x weekly - 3 sets - 10 reps - Wrist Flexion and Extension With Shoulder Sling  - 1 x daily - 7 x weekly - 3 sets - 10 reps - Supported Elbow Flexion Extension PROM  - 1 x daily - 7 x weekly - 1 sets - 10 reps   ASSESSMENT:  CLINICAL IMPRESSION: William Howe is a 34 y.o. male who was referred to physical therapy for evaluation and treatment for L shoulder diagnostic arthroscopy and bicep tenodesis.   His MRI in 8/25 showed bicep tenosynovitis and he failed all  cortisone injection trials as well as PT.  HE is 2 days post op and has already f/u with ortho in office yesterday.  His operative note also indicates that he also had diffuse subacromial bursitis which was debrided.   Otherwise his RTC, GH joint, labrum all appeared normal.    He is in a sling.      Patient reports onset of *** pain beginning ***. Pain is worse with ***.  Patient has deficits in *** ROM, *** flexibility, *** strength, ***abnormal posture, and TTP with abnormal muscle tension *** which are interfering with ADLs and are impacting quality of life.  On QuickDASH patient scored ***/100 demonstrating ***% disability.  William Howe will benefit from skilled PT to address above deficits to improve mobility and activity tolerance with decreased pain interference.   OBJECTIVE IMPAIRMENTS: decreased ROM, decreased strength, impaired UE functional use, and pain.   ACTIVITY LIMITATIONS: carrying, lifting, sleeping, reach over head, and hygiene/grooming  PARTICIPATION LIMITATIONS: meal prep, cleaning, laundry, driving, shopping, community activity, and occupation  PERSONAL FACTORS: no personal factors are also affecting patient's functional outcome.   REHAB POTENTIAL: Good  CLINICAL DECISION MAKING: Evolving/moderate complexity  EVALUATION COMPLEXITY: Moderate   GOALS: Goals reviewed with patient? Yes  SHORT TERM GOALS: Target date: 10/04/2024   Patient will be independent with initial HEP to improve outcomes and carryover.  Baseline: 100% PT assist required for correct completion Goal status: INITIAL  2.  Patient will report 25% improvement in L shoulder pain to improve QOL.   Baseline: *** Goal status: INITIAL  3.  *** Baseline: *** Goal status: INITIAL  LONG TERM GOALS: Target date: 11/01/2024   Patient will be independent with ongoing/advanced HEP for self-management at home.  Baseline: no advanced HEP yet Goal status: INITIAL  2.  Patient will report 50-75% improvement  in L shoulder pain to improve QOL.  Baseline: *** Goal status: INITIAL  3.  Patient to demonstrate improved upright posture with posterior shoulder girdle engaged to promote improved glenohumeral joint mobility. Baseline: *** Goal status: INITIAL  4.  Patient to improve L shoulder AROM to WNL without pain provocation to allow for increased ease of ADLs.  Baseline: Refer to above UE ROM table Goal status: INITIAL  5.  Patient will demonstrate improved LUE strength to >/= 5/5 for functional UE use. Baseline: Refer to above UE MMT table Goal status: INITIAL  6  Patient will report </= ***% on QuickDASH (MCID = 14%) to demonstrate improved functional ability.  Baseline: *** Goal status: INITIAL  7.  Patient will ***   Baseline: *** Goal status: {GOALSTATUS:25110}   8. *** Baseline: *** Goal status: {GOALSTATUS:25110}   PLAN:  PT FREQUENCY: 1-2x/week  PT DURATION: 8 weeks  PLANNED INTERVENTIONS: 97110-Therapeutic exercises, 97530- Therapeutic activity, [id number]- Neuromuscular re-education, 97535- Self Care, 02859- Manual therapy, G0283- Electrical stimulation (unattended), 97016- Vasopneumatic device, 20560 (1-2 muscles), 20561 (3+ muscles)- Dry Needling, Patient/Family education, Taping, Joint mobilization, Cryotherapy, and Moist heat  PLAN FOR NEXT SESSION: PIERRETTE RED SENIOR, PT 09/06/2024, 10:31 PM  "

## 2024-09-06 ENCOUNTER — Encounter: Payer: Self-pay | Admitting: Rehabilitation

## 2024-09-06 ENCOUNTER — Ambulatory Visit: Attending: Orthopedic Surgery | Admitting: Rehabilitation

## 2024-09-06 ENCOUNTER — Other Ambulatory Visit: Payer: Self-pay

## 2024-09-06 DIAGNOSIS — M6281 Muscle weakness (generalized): Secondary | ICD-10-CM | POA: Insufficient documentation

## 2024-09-06 DIAGNOSIS — M25612 Stiffness of left shoulder, not elsewhere classified: Secondary | ICD-10-CM | POA: Diagnosis present

## 2024-09-06 DIAGNOSIS — G8929 Other chronic pain: Secondary | ICD-10-CM | POA: Diagnosis present

## 2024-09-06 DIAGNOSIS — M25512 Pain in left shoulder: Secondary | ICD-10-CM | POA: Diagnosis present

## 2024-09-11 ENCOUNTER — Ambulatory Visit

## 2024-09-11 DIAGNOSIS — M6281 Muscle weakness (generalized): Secondary | ICD-10-CM

## 2024-09-11 DIAGNOSIS — M25512 Pain in left shoulder: Secondary | ICD-10-CM | POA: Diagnosis not present

## 2024-09-11 DIAGNOSIS — M25612 Stiffness of left shoulder, not elsewhere classified: Secondary | ICD-10-CM

## 2024-09-11 NOTE — Therapy (Signed)
 " OUTPATIENT PHYSICAL THERAPY SHOULDER TREAT   Patient Name: William Howe MRN: [id number] DOB:1991-03-21, 34 y.o., male Today's Date: 09/11/2024   END OF SESSION:  PT End of Session - 09/11/24 1707     Visit Number 2    Date for Recertification  06/11/24    Authorization Type [employer]    PT Start Time 1622    PT Stop Time 1701    PT Time Calculation (min) 39 min    Activity Tolerance Patient tolerated treatment well    Behavior During Therapy [facility] for tasks assessed/performed           History reviewed. No pertinent past medical history. Past Surgical History:  Procedure Laterality Date   HAND SURGERY     TOE SURGERY     There are no active problems to display for this patient.   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  EVAL:  34 y/o patient referred to PT from Dr Case following a diagnostic L shoulder arthroscopy with bicep tenodesis.   Nadim is well known to us  from recent episodes of PT for L neck pain and L shoulder pain.  Unfortunately his L shoulder pain failed to improve with PT.   He had multiple steroid injections that did not provide any relief or reveal a clear source of his pain.  His surgery revealed extensive synovial adhesion and scarring between the rotator cuff and the biceps tendon.   He also had diffuse subacromial bursitis which  was debrided from the subacromial periosteum.   He is 2 days post op and has already f/u with Dr Case in the office yesterday.  He is to wear his sling at all times to his knowledge except for therapy and home exercise.  Prior to surgery he worked as a production designer, theatre/television/film at [employer], but was struggling to fulfill job responsibilities to due to the L shoulder pain with lifting.  He hopes to return to work after  his recovery, but is not able to work right now due to surgical restrictions.  He is normally independent with all activities, ADLs, etc.   He enjoys playing video games in his free time, but is advised that he should not be using the LUE for gaming right now.   He is to speak with his surgeon regarding when it might be ok to resume gaming activities.    We will be following the [place] Bicep tenodesis protocol which is available online.     PAIN: Are you having pain? Yes: NPRS scale: 4/10 now;  8/10 worst Pain location: L shoulder Pain description: aching constantly;  sharper at times Aggravating factors: moving the R arm Relieving factors: rest, pain meds  PERTINENT HISTORY:  unremarkable  PRECAUTIONS: Shoulder bicep tenodesis protocol precautions--patient is given a copy of the [place] protocol today with all restrictions noted---Reviewed with patient on today's visit:  No lifting, no carrying, No WB, No pushing/pulling, Sling all times;  PROM only of the R shoulder; no AROM R shoulder; Avoid lying on R side;  sleep in recliner PRN;  support LUE in supine positions with pillow under the arm to avoid extension past plane of the body;  no load on the L biceps  RED FLAGS: None  HAND DOMINANCE: Right  WEIGHT BEARING RESTRICTIONS: Yes NWB LUE  FALLS:  Has patient fallen in last 6 months? No  LIVING ENVIRONMENT: Lives with: lives with their family Lives in: House/apartment Stairs: N/A Has following equipment at home: None  OCCUPATION: production designer, theatre/television/film at [employer]  PLOF: Independent  with gait  PATIENT GOALS: return to work painfree   OBJECTIVE: (objective measures completed at initial evaluation unless otherwise dated)  DIAGNOSTIC FINDINGS:   EXAM: MRI CERVICAL SPINE WITHOUT CONTRAST   TECHNIQUE: Multiplanar, multisequence MR imaging of the cervical spine was performed. No intravenous contrast was administered.   COMPARISON:  None Available.   FINDINGS: Alignment: Normal.   Vertebrae: No fracture, evidence of discitis, or bone lesion.   Cord: Normal signal throughout.   Posterior Fossa, vertebral arteries, paraspinal tissues: Negative.   Disc levels:   The central canal and foramina are widely patent at all levels. Disc height and hydration are maintained throughout. Minimal disc bulging C5-6 and C6-7 is noted.   IMPRESSION: Minimal disc bulging C5-6 and C6-7. The exam is otherwise negative. The central canal and foramina are widely patent at all levels.     Electronically Signed   By: Debby Prader M.D.   On: 12/06/2023 09:02  PATIENT SURVEYS:  Quick Dash:  QUICK DASH  Please rate your ability do the following activities in the last week by selecting the number below the appropriate response.   Activities Rating  Open a tight or new jar.  5 = Unable  Do heavy household chores (e.g., wash walls, floors). 5 = Unable  Carry a shopping bag or briefcase 5 = Unable  Wash your back. 5 = Unable  Use a knife to cut food. 5 = Unable  Recreational activities in which you take some force or impact through your arm, shoulder or hand (e.g., golf, hammering, tennis, etc.). 5 = Unable  During the past week, to what extent has your arm, shoulder or hand problem interfered with your normal social activities with family, friends, neighbors or groups?  2 = Slightly  During the past week, were you limited in your work or other regular daily activities as a result of your arm, shoulder or hand  problem? 5 = Unable  Rate the severity of the following  symptoms in the last week: Arm, Shoulder, or hand pain. 3 = Moderate  Rate the severity of the following symptoms in the last week: Tingling (pins and needles) in your arm, shoulder or hand. 1 = none  During the past week, how much difficulty have you had sleeping because of the pain in your arm, shoulder or hand?  3 = Moderate difficulty   (A QuickDASH score may not be calculated if there is greater than 1 missing item.)  Quick Dash Disability/Symptom Score: [(sum of 44 (n) responses/11 (n)] x 25 = 75  Minimally Clinically Important Difference (MCID): 15-20 points  (Franchignoni, F. et al. (2013). Minimally clinically important difference of the disabilities of the arm, shoulder, and hand outcome measures (DASH) and its shortened version (Quick DASH). Journal of Orthopaedic & Sports Physical Therapy, 44(1), 30-39)   COGNITION: Overall cognitive status: Within functional limits for tasks assessed     SENSATION: WFL  POSTURE: rounded shoulders, forward head, and some scapular winging  UPPER EXTREMITY ROM:   Passive ROM Right eval Left eval  Shoulder flexion 180 90  Shoulder extension 60+ NT (precautions)  Shoulder abduction 180 70  Shoulder adduction Behind opposite shoulder NT   Shoulder internal rotation 90+ 40 at 70 deg scaption  Shoulder external rotation 90+ 20 (restricted to no more than 40 deg per protocol initially)  Elbow flexion 140 125  Elbow extension 0 -20  Wrist flexion    Wrist extension    Wrist ulnar deviation    Wrist radial deviation    Wrist pronation    Wrist supination    (Blank rows = not tested)  UPPER EXTREMITY MMT:   not tested on eval due to surgical restrictions  MMT Right eval Left eval  Shoulder flexion    Shoulder extension    Shoulder abduction    Shoulder adduction    Shoulder internal rotation    Shoulder external rotation    Middle trapezius    Lower trapezius    Elbow flexion    Elbow extension    Wrist flexion    Wrist  extension    Wrist ulnar deviation    Wrist radial deviation    Wrist pronation    Wrist supination    Grip strength (lbs)    (Blank rows = not tested)  SHOULDER SPECIAL TESTS:  N/A  JOINT MOBILITY TESTING:  NT today, but patient is generally hypermobile  PALPATION:  TTP around the L shoulder in general.   He has telfa/tegaderm dressings over his scope sites that he was provided supplies to change at home until MD f/u    TODAY'S TREATMENT:  09/11/24 Pendulums 3 way ( fwd/back, side to side, cw/ccw) x10 each Supine R shoulder PROM flex,scap, ER, elbow flex + ext, supination/pronation to tolerance Seated R scapular shrugs, depression, protraction/retraction, ccw/cw x 10 each Education provided on post op precautions, home instruction provided for sling use and HEP  09/06/24 SELF CARE: Provided education on PT POC progression, on post-surgical precautions, and on pain management options.; initial HEP  MODALITIES: Vasopneumatic ice to L shoulder at min compression x 34 deg  PATIENT EDUCATION:  Education details: PT eval findings, anticipated POC, and initial HEP  Person educated: Patient Education method: Explanation, Demonstration, Verbal cues, Tactile cues, Handouts, and MedBridgeGO app access provided Education comprehension: verbalized understanding, returned demonstration, verbal cues required, tactile cues required, and needs further education  HOME EXERCISE PROGRAM: Access  Code: [email or web address] URL: [email or web address] Date: 09/06/2024 Prepared by: Garnette Montclair  Exercises - Seated Shoulder Shrugs  - 1 x daily - 7 x weekly - 2 sets - 10 reps - Seated Shoulder Rolls  - 1 x daily - 7 x weekly - 2 sets - 10 reps - Ball Squeeze With Shoulder Sling  - 1 x daily - 7 x weekly - 3 sets - 10 reps - Wrist Flexion and Extension With Shoulder Sling  - 1 x daily - 7 x weekly - 3 sets - 10 reps - Supported Elbow Flexion Extension PROM  - 1 x daily - 7 x weekly - 1 sets  - 10 reps   ASSESSMENT:  CLINICAL IMPRESSION: Pt arrives with no report of pain but soreness in his L bicep area and along his incisions. Initiated very light shoulder PROM today with good response but notable muscle guarding and cues throughout to relax. He was able to complete the interventions for shoulder PROM and scapular mobility. He declined vaso today.  Aasir Daigler is a 34 y.o. male who was referred to physical therapy for evaluation and treatment for L shoulder diagnostic arthroscopy and bicep tenodesis.   His MRI in 8/25 showed bicep tenosynovitis and he failed all cortisone injection trials as well as PT.   HE is 2 days post op and has already f/u with ortho in office yesterday.  His operative note also indicates that he also had diffuse subacromial bursitis which was debrided.   Otherwise his RTC, GH joint, labrum all appeared normal.    He is in a sling that is to be worn at all times. Pain is moderate in the L shoulder, but controlled with meds.  He has tegarderm bandages in place.   There is no drainage, no redness.   Edema is moderate. He is provided with initial HEP today for elbow PROM, wrist/hand/scapular AROM.  HE can teach back all shoulder precautions including keeping his L arm propped on a pillow when reclined/supine to avoid any shoulder extension.   He knows not to actively load his biceps.    Patient has deficits in L shoulder ROM, L shoulder flexibility, LUE strength, ADL/work abilities; and pain which are interfering with ADLs and are impacting quality of life.  On QuickDASH patient scored 75/100 demonstrating 75% disability.  Aldon will benefit from skilled PT to address above deficits to improve mobility and activity tolerance with decreased pain interference.   OBJECTIVE IMPAIRMENTS: decreased ROM, decreased strength, impaired UE functional use, and pain.   ACTIVITY LIMITATIONS: carrying, lifting, sleeping, reach over head, and hygiene/grooming  PARTICIPATION  LIMITATIONS: meal prep, cleaning, laundry, driving, shopping, community activity, and occupation  PERSONAL FACTORS: no personal factors are also affecting patient's functional outcome.   REHAB POTENTIAL: Good  CLINICAL DECISION MAKING: Evolving/moderate complexity  EVALUATION COMPLEXITY: Moderate   GOALS: Goals reviewed with patient? Yes  SHORT TERM GOALS: Target date: 10/04/2024   Patient will be independent with initial HEP to improve outcomes and carryover.  Baseline: 100% PT assist required for correct completion Goal status: INITIAL  2.  Patient will report 25% improvement in L shoulder pain to improve QOL.   Baseline: 8/10 worst Goal status: INITIAL  3.  Patient will demonstrate full L shoulder PROM to be able to progress to phase II on bicep tenodesis protocol Baseline: see tables above Goal status: INITIAL  LONG TERM GOALS: Target date: 11/01/2024   Patient will be independent with ongoing/advanced HEP for self-management at home.  Baseline:  no advanced HEP yet Goal status: INITIAL  2.  Patient will report 50-75% improvement in L shoulder pain to improve QOL.  Baseline: 8/10 Goal status: INITIAL  3.  Patient to demonstrate improved upright posture with posterior shoulder girdle engaged to promote improved glenohumeral joint mobility. Baseline: forward head/rounded shoulders/winged scapula Goal status: INITIAL  4.  Patient to improve L shoulder AROM to WNL without pain provocation to allow for increased ease of ADLs.  Baseline: Refer to above UE ROM table Goal status: INITIAL  5.  Patient will demonstrate improved LUE strength to >/= 5/5 for functional UE use. Baseline: Refer to above UE MMT table Goal status: INITIAL  6  Patient will report </= 30% on QuickDASH (MCID = 14%) to demonstrate improved functional ability.  Baseline: 75% Goal status: INITIAL  7.  Patient will be able to lift 10 lbs overhead to return to work   Baseline: NT due to surgical  precautions Goal status: INITIAL    PLAN:  PT FREQUENCY: 1-2x/week  PT DURATION: 8 weeks  PLANNED INTERVENTIONS: 97110-Therapeutic exercises, 97530- Therapeutic activity, 97112- Neuromuscular re-education, 97535- Self Care, [id number]- Manual therapy, [id number]- Electrical stimulation (unattended), 97016- Vasopneumatic device, 20560 (1-2 muscles), 20561 (3+ muscles)- Dry Needling, Patient/Family education, Taping, Joint mobilization, Cryotherapy, and Moist heat  PLAN FOR NEXT SESSION: Continue with L shoulder PROM per protocol avoiding any shoulder extension; elbow PROM --no bicep loading;  progress per protocol   Sol LITTIE Gaskins, PTA 09/11/2024, 5:07 PM  "

## 2024-09-17 ENCOUNTER — Ambulatory Visit

## 2024-09-19 NOTE — Therapy (Incomplete)
 " OUTPATIENT PHYSICAL THERAPY SHOULDER TREAT   Patient Name: William Howe MRN: [id number] DOB:1990-09-12, 34 y.o., male Today's Date: 09/19/2024   END OF SESSION:     No past medical history on file. Past Surgical History:  Procedure Laterality Date   HAND SURGERY     TOE SURGERY     There are no active problems to display for this patient.   PCP: Jason Leita Repine, FNP (Inactive)   REFERRING PROVIDER: Case, Jordan, MD   REFERRING DIAG: Biceps tendinopathy, left (F32.077)  THERAPY DIAG:  No diagnosis found.  RATIONALE FOR EVALUATION AND TREATMENT: Rehabilitation  ONSET DATE: Surgery 09/04/24  NEXT MD VISIT:    SUBJECTIVE:                                                                                                                                                                                                         SUBJECTIVE STATEMENT: Pt reports only soreness today along the incisions.  EVAL:  34 y/o patient referred to PT from Dr Case following a diagnostic L shoulder arthroscopy with bicep tenodesis.   William Howe is well known to us  from recent episodes of PT for L neck pain and L shoulder pain.  Unfortunately his L shoulder pain failed to improve with PT.   He had multiple steroid injections that did not provide any relief or reveal a clear source of his pain.  His surgery revealed extensive synovial adhesion and scarring between the rotator cuff and the biceps tendon.   He also had diffuse subacromial bursitis which was debrided from the subacromial periosteum.   He is 2 days post op and has already f/u with Dr Case in the office yesterday.  He is to wear his sling at all times to his knowledge except for therapy and home exercise.  Prior to surgery he worked as a production designer, theatre/television/film at [employer], but was struggling to fulfill job responsibilities to due to the L shoulder pain with lifting.  He hopes to return to work after his recovery, but is not able to work right now due to  surgical restrictions.  He is normally independent with all activities, ADLs, etc.   He enjoys playing video games in his free time, but is advised that he should not be using the LUE for gaming right now.   He is to speak with his surgeon regarding when it might be ok to resume gaming activities.    We will be following the [place] Bicep tenodesis protocol which is available online.     PAIN: Are you  having pain? Yes: NPRS scale: 4/10 now;  8/10 worst Pain location: L shoulder Pain description: aching constantly;  sharper at times Aggravating factors: moving the R arm Relieving factors: rest, pain meds  PERTINENT HISTORY:  unremarkable  PRECAUTIONS: Shoulder bicep tenodesis protocol precautions--patient is given a copy of the [place] protocol today with all restrictions noted---Reviewed with patient on today's visit:  No lifting, no carrying, No WB, No pushing/pulling, Sling all times;  PROM only of the R shoulder; no AROM R shoulder; Avoid lying on R side;  sleep in recliner PRN;  support LUE in supine positions with pillow under the arm to avoid extension past plane of the body;  no load on the L biceps  RED FLAGS: None  HAND DOMINANCE: Right  WEIGHT BEARING RESTRICTIONS: Yes NWB LUE  FALLS:  Has patient fallen in last 6 months? No  LIVING ENVIRONMENT: Lives with: lives with their family Lives in: House/apartment Stairs: N/A Has following equipment at home: None  OCCUPATION: production designer, theatre/television/film at [employer]  PLOF: Independent with gait  PATIENT GOALS: return to work painfree   OBJECTIVE: (objective measures completed at initial evaluation unless otherwise dated)  DIAGNOSTIC FINDINGS:   EXAM: MRI CERVICAL SPINE WITHOUT CONTRAST   TECHNIQUE: Multiplanar, multisequence MR imaging of the cervical spine was performed. No intravenous contrast was administered.   COMPARISON:  None Available.   FINDINGS: Alignment: Normal.   Vertebrae: No fracture, evidence of  discitis, or bone lesion.   Cord: Normal signal throughout.   Posterior Fossa, vertebral arteries, paraspinal tissues: Negative.   Disc levels:   The central canal and foramina are widely patent at all levels. Disc height and hydration are maintained throughout. Minimal disc bulging C5-6 and C6-7 is noted.   IMPRESSION: Minimal disc bulging C5-6 and C6-7. The exam is otherwise negative. The central canal and foramina are widely patent at all levels.     Electronically Signed   By: Debby Prader M.D.   On: 12/06/2023 09:02  PATIENT SURVEYS:  Quick Dash:  QUICK DASH  Please rate your ability do the following activities in the last week by selecting the number below the appropriate response.   Activities Rating  Open a tight or new jar.  5 = Unable  Do heavy household chores (e.g., wash walls, floors). 5 = Unable  Carry a shopping bag or briefcase 5 = Unable  Wash your back. 5 = Unable  Use a knife to cut food. 5 = Unable  Recreational activities in which you take some force or impact through your arm, shoulder or hand (e.g., golf, hammering, tennis, etc.). 5 = Unable  During the past week, to what extent has your arm, shoulder or hand problem interfered with your normal social activities with family, friends, neighbors or groups?  2 = Slightly  During the past week, were you limited in your work or other regular daily activities as a result of your arm, shoulder or hand problem? 5 = Unable  Rate the severity of the following symptoms in the last week: Arm, Shoulder, or hand pain. 3 = Moderate  Rate the severity of the following symptoms in the last week: Tingling (pins and needles) in your arm, shoulder or hand. 1 = none  During the past week, how much difficulty have you had sleeping because of the pain in your arm, shoulder or hand?  3 = Moderate difficulty   (A QuickDASH score may not be calculated if there is greater than 1 missing item.)  Quick Dash Disability/Symptom  Score: [(sum of 44 (n) responses/11 (n)] x 25 = 75  Minimally Clinically Important Difference (MCID): 15-20 points  (Franchignoni, F. et al. (2013). Minimally clinically important difference of the disabilities of the arm, shoulder, and hand outcome measures (DASH) and its shortened version (Quick DASH). Journal of Orthopaedic & Sports Physical Therapy, 44(1), 30-39)   COGNITION: Overall cognitive status: Within functional limits for tasks assessed     SENSATION: WFL  POSTURE: rounded shoulders, forward head, and some scapular winging  UPPER EXTREMITY ROM:   Passive ROM Right eval Left eval  Shoulder flexion 180 90  Shoulder extension 60+ NT (precautions)  Shoulder abduction 180 70  Shoulder adduction Behind opposite shoulder NT   Shoulder internal rotation 90+ 40 at 70 deg scaption  Shoulder external rotation 90+ 20 (restricted to no more than 40 deg per protocol initially)  Elbow flexion 140 125  Elbow extension 0 -20  Wrist flexion    Wrist extension    Wrist ulnar deviation    Wrist radial deviation    Wrist pronation    Wrist supination    (Blank rows = not tested)  UPPER EXTREMITY MMT:   not tested on eval due to surgical restrictions  MMT Right eval Left eval  Shoulder flexion    Shoulder extension    Shoulder abduction    Shoulder adduction    Shoulder internal rotation    Shoulder external rotation    Middle trapezius    Lower trapezius    Elbow flexion    Elbow extension    Wrist flexion    Wrist extension    Wrist ulnar deviation    Wrist radial deviation    Wrist pronation    Wrist supination    Grip strength (lbs)    (Blank rows = not tested)  SHOULDER SPECIAL TESTS:  N/A  JOINT MOBILITY TESTING:  NT today, but patient is generally hypermobile  PALPATION:  TTP around the L shoulder in general.   He has telfa/tegaderm dressings over his scope sites that he was provided supplies to change at home until MD f/u    TODAY'S TREATMENT:   2  weeks post op Isometrics in sling?   09/11/24 Pendulums 3 way ( fwd/back, side to side, cw/ccw) x10 each Supine R shoulder PROM flex,scap, ER, elbow flex + ext, supination/pronation to tolerance Seated R scapular shrugs, depression, protraction/retraction, ccw/cw x 10 each Education provided on post op precautions, home instruction provided for sling use and HEP  09/06/24 SELF CARE: Provided education on PT POC progression, on post-surgical precautions, and on pain management options.; initial HEP  MODALITIES: Vasopneumatic ice to L shoulder at min compression x 34 deg  PATIENT EDUCATION:  Education details: PT eval findings, anticipated POC, and initial HEP  Person educated: Patient Education method: Explanation, Demonstration, Verbal cues, Tactile cues, Handouts, and MedBridgeGO app access provided Education comprehension: verbalized understanding, returned demonstration, verbal cues required, tactile cues required, and needs further education  HOME EXERCISE PROGRAM: Access Code: [email or web address] URL: [email or web address] Date: 09/06/2024 Prepared by: Garnette Montclair  Exercises - Seated Shoulder Shrugs  - 1 x daily - 7 x weekly - 2 sets - 10 reps - Seated Shoulder Rolls  - 1 x daily - 7 x weekly - 2 sets - 10 reps - Ball Squeeze With Shoulder Sling  - 1 x daily - 7 x weekly - 3 sets - 10 reps - Wrist Flexion and Extension With Shoulder Sling  - 1 x daily -  7 x weekly - 3 sets - 10 reps - Supported Elbow Flexion Extension PROM  - 1 x daily - 7 x weekly - 1 sets - 10 reps   ASSESSMENT:  CLINICAL IMPRESSION: 2 weeks post op  Bubba Vanbenschoten is a 34 y.o. male who was referred to physical therapy for evaluation and treatment for L shoulder diagnostic arthroscopy and bicep tenodesis.   His MRI in 8/25 showed bicep tenosynovitis and he failed all cortisone injection trials as well as PT.   HE is 2 days post op and has already f/u with ortho in office yesterday.  His  operative note also indicates that he also had diffuse subacromial bursitis which was debrided.   Otherwise his RTC, GH joint, labrum all appeared normal.    He is in a sling that is to be worn at all times. Pain is moderate in the L shoulder, but controlled with meds.  He has tegarderm bandages in place.   There is no drainage, no redness.   Edema is moderate. He is provided with initial HEP today for elbow PROM, wrist/hand/scapular AROM.  HE can teach back all shoulder precautions including keeping his L arm propped on a pillow when reclined/supine to avoid any shoulder extension.   He knows not to actively load his biceps.    Patient has deficits in L shoulder ROM, L shoulder flexibility, LUE strength, ADL/work abilities; and pain which are interfering with ADLs and are impacting quality of life.  On QuickDASH patient scored 75/100 demonstrating 75% disability.  Ramondo will benefit from skilled PT to address above deficits to improve mobility and activity tolerance with decreased pain interference.   OBJECTIVE IMPAIRMENTS: decreased ROM, decreased strength, impaired UE functional use, and pain.   ACTIVITY LIMITATIONS: carrying, lifting, sleeping, reach over head, and hygiene/grooming  PARTICIPATION LIMITATIONS: meal prep, cleaning, laundry, driving, shopping, community activity, and occupation  PERSONAL FACTORS: no personal factors are also affecting patient's functional outcome.   REHAB POTENTIAL: Good  CLINICAL DECISION MAKING: Evolving/moderate complexity  EVALUATION COMPLEXITY: Moderate   GOALS: Goals reviewed with patient? Yes  SHORT TERM GOALS: Target date: 10/04/2024   Patient will be independent with initial HEP to improve outcomes and carryover.  Baseline: 100% PT assist required for correct completion Goal status: INITIAL  2.  Patient will report 25% improvement in L shoulder pain to improve QOL.   Baseline: 8/10 worst Goal status: INITIAL  3.  Patient will demonstrate  full L shoulder PROM to be able to progress to phase II on bicep tenodesis protocol Baseline: see tables above Goal status: INITIAL  LONG TERM GOALS: Target date: 11/01/2024   Patient will be independent with ongoing/advanced HEP for self-management at home.  Baseline: no advanced HEP yet Goal status: INITIAL  2.  Patient will report 50-75% improvement in L shoulder pain to improve QOL.  Baseline: 8/10 Goal status: INITIAL  3.  Patient to demonstrate improved upright posture with posterior shoulder girdle engaged to promote improved glenohumeral joint mobility. Baseline: forward head/rounded shoulders/winged scapula Goal status: INITIAL  4.  Patient to improve L shoulder AROM to WNL without pain provocation to allow for increased ease of ADLs.  Baseline: Refer to above UE ROM table Goal status: INITIAL  5.  Patient will demonstrate improved LUE strength to >/= 5/5 for functional UE use. Baseline: Refer to above UE MMT table Goal status: INITIAL  6  Patient will report </= 30% on QuickDASH (MCID = 14%) to demonstrate improved functional ability.  Baseline:  75% Goal status: INITIAL  7.  Patient will be able to lift 10 lbs overhead to return to work   Baseline: NT due to surgical precautions Goal status: INITIAL    PLAN:  PT FREQUENCY: 1-2x/week  PT DURATION: 8 weeks  PLANNED INTERVENTIONS: 97110-Therapeutic exercises, 97530- Therapeutic activity, [id number]- Neuromuscular re-education, 97535- Self Care, [id number]- Manual therapy, [id number]- Electrical stimulation (unattended), 97016- Vasopneumatic device, 20560 (1-2 muscles), 20561 (3+ muscles)- Dry Needling, Patient/Family education, Taping, Joint mobilization, Cryotherapy, and Moist heat  PLAN FOR NEXT SESSION: 2 weeks post op 09/18/24:   Continue with L shoulder PROM per protocol avoiding any shoulder extension; elbow PROM --no bicep loading;  progress per protocol   Kyandre Okray, PT 09/19/2024, 8:49 PM  "

## 2024-09-20 ENCOUNTER — Ambulatory Visit

## 2024-09-20 DIAGNOSIS — G8929 Other chronic pain: Secondary | ICD-10-CM

## 2024-09-20 DIAGNOSIS — M25512 Pain in left shoulder: Secondary | ICD-10-CM | POA: Diagnosis not present

## 2024-09-20 DIAGNOSIS — M6281 Muscle weakness (generalized): Secondary | ICD-10-CM

## 2024-09-20 DIAGNOSIS — M25612 Stiffness of left shoulder, not elsewhere classified: Secondary | ICD-10-CM

## 2024-09-20 NOTE — Therapy (Signed)
 " OUTPATIENT PHYSICAL THERAPY SHOULDER TREAT   Patient Name: William Howe MRN: [id number] DOB:08/28/1990, 34 y.o., male Today's Date: 09/20/2024   END OF SESSION:  PT End of Session - 09/20/24 1723     Visit Number 3    Date for Recertification  11/01/24    Authorization Type [employer]    PT Start Time 1536    PT Stop Time 1628    PT Time Calculation (min) 52 min    Activity Tolerance Patient tolerated treatment well    Behavior During Therapy [facility] for tasks assessed/performed            History reviewed. No pertinent past medical history. Past Surgical History:  Procedure Laterality Date   HAND SURGERY     TOE SURGERY     There are no active problems to display for this patient.   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   EVAL:  34 y/o patient referred to PT from Dr Case following a diagnostic L shoulder arthroscopy with bicep tenodesis.   William Howe is well known to us  from recent episodes of PT for L neck pain and L shoulder pain.  Unfortunately his L shoulder pain failed to improve with PT.   He had multiple steroid injections that did not provide any relief or reveal a clear source of his pain.  His surgery revealed extensive synovial adhesion and scarring between the rotator cuff and the biceps tendon.   He  also had diffuse subacromial bursitis which was debrided from the subacromial periosteum.   He is 2 days post op and has already f/u with Dr Case in the office yesterday.  He is to wear his sling at all times to his knowledge except for therapy and home exercise.  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  He hopes to return to work after his recovery, but is not able to work right now due to surgical restrictions.  He is normally independent with all activities, ADLs, etc.   He enjoys playing video games in his free time, but is advised that he should not be using the LUE for gaming right now.   He is to speak with his surgeon regarding when it might be ok to resume gaming activities.    We will be following the [place] Bicep tenodesis protocol which is available online.     PAIN: Are you having pain? Yes: NPRS scale: 1-2/10 now;  8/10 worst Pain location: L shoulder Pain description: aching constantly;  sharper at times Aggravating factors: moving the R arm Relieving factors: rest, pain meds  PERTINENT HISTORY:  unremarkable  PRECAUTIONS: Shoulder bicep tenodesis protocol precautions--patient is given a copy of the [place] protocol today with all restrictions noted---Reviewed with patient on today's visit:  No lifting, no carrying, No WB, No pushing/pulling, Sling all times;  PROM only of the R shoulder; no AROM R shoulder; Avoid lying on R side;  sleep in recliner PRN;  support LUE in supine positions with pillow under the arm to avoid extension past plane of the body;  no load on the L biceps  RED FLAGS: None  HAND DOMINANCE: Right  WEIGHT BEARING RESTRICTIONS: Yes NWB LUE  FALLS:  Has patient fallen in last 6 months? No  LIVING ENVIRONMENT: Lives with: lives with their family Lives in: House/apartment Stairs: N/A Has following equipment at home: None  OCCUPATION: production designer, theatre/television/film  at [employer]  PLOF: Independent with gait  PATIENT GOALS: return to work painfree   OBJECTIVE: (objective measures completed at initial evaluation unless otherwise dated)  DIAGNOSTIC FINDINGS:   EXAM: MRI CERVICAL SPINE WITHOUT CONTRAST   TECHNIQUE: Multiplanar, multisequence MR imaging of the cervical spine was performed. No intravenous contrast was administered.   COMPARISON:  None Available.   FINDINGS: Alignment: Normal.   Vertebrae: No fracture, evidence of discitis, or bone lesion.   Cord: Normal signal throughout.   Posterior Fossa, vertebral arteries, paraspinal tissues: Negative.   Disc levels:   The central canal and foramina are widely patent at all levels. Disc height and hydration are maintained throughout. Minimal disc bulging C5-6 and C6-7 is noted.   IMPRESSION: Minimal disc bulging C5-6 and C6-7. The exam is otherwise negative. The central canal and foramina are widely patent at all levels.     Electronically Signed   By: Debby Prader M.D.   On: 12/06/2023 09:02  PATIENT SURVEYS:  Quick Dash:  QUICK DASH  Please rate your ability do the following activities in the last week by selecting the number below the appropriate response.   Activities Rating  Open a tight or new jar.  5 = Unable  Do heavy household chores (e.g., wash walls, floors). 5 = Unable  Carry a shopping bag or briefcase 5 = Unable  Wash your back. 5 = Unable  Use a knife to cut food. 5 = Unable  Recreational activities in which you take some force or impact through your arm, shoulder or hand (e.g., golf, hammering, tennis, etc.). 5 = Unable  During the past week, to what extent has your arm, shoulder or hand problem interfered with your normal social activities with family, friends, neighbors or groups?  2 = Slightly  During the past week, were you limited in your work or  other regular daily activities as a result of your arm, shoulder or hand problem? 5 = Unable   Rate the severity of the following symptoms in the last week: Arm, Shoulder, or hand pain. 3 = Moderate  Rate the severity of the following symptoms in the last week: Tingling (pins and needles) in your arm, shoulder or hand. 1 = none  During the past week, how much difficulty have you had sleeping because of the pain in your arm, shoulder or hand?  3 = Moderate difficulty   (A QuickDASH score may not be calculated if there is greater than 1 missing item.)  Quick Dash Disability/Symptom Score: [(sum of 44 (n) responses/11 (n)] x 25 = 75  Minimally Clinically Important Difference (MCID): 15-20 points  (Franchignoni, F. et al. (2013). Minimally clinically important difference of the disabilities of the arm, shoulder, and hand outcome measures (DASH) and its shortened version (Quick DASH). Journal of Orthopaedic & Sports Physical Therapy, 44(1), 30-39)   COGNITION: Overall cognitive status: Within functional limits for tasks assessed     SENSATION: WFL  POSTURE: rounded shoulders, forward head, and some scapular winging  UPPER EXTREMITY ROM:   Passive ROM Right eval Left eval L PROM 09/20/24  Shoulder flexion 180 90 100  Shoulder extension 60+ NT (precautions)   Shoulder abduction 180 70 Scaption- 105  Shoulder adduction Behind opposite shoulder NT    Shoulder internal rotation 90+ 40 at 70 deg scaption   Shoulder external rotation 90+ 20 (restricted to no more than 40 deg per protocol initially) Unable to get to neutral  Elbow flexion 140 125   Elbow extension 0 -20   Wrist flexion     Wrist extension     Wrist ulnar deviation     Wrist radial deviation     Wrist pronation     Wrist supination     (Blank rows = not tested)  UPPER EXTREMITY MMT:   not tested on eval due to surgical restrictions  MMT Right eval Left eval  Shoulder flexion    Shoulder extension    Shoulder abduction    Shoulder adduction    Shoulder internal rotation    Shoulder external rotation     Middle trapezius    Lower trapezius    Elbow flexion    Elbow extension    Wrist flexion    Wrist extension    Wrist ulnar deviation    Wrist radial deviation    Wrist pronation    Wrist supination    Grip strength (lbs)    (Blank rows = not tested)  SHOULDER SPECIAL TESTS:  N/A  JOINT MOBILITY TESTING:  NT today, but patient is generally hypermobile  PALPATION:  TTP around the L shoulder in general.   He has telfa/tegaderm dressings over his scope sites that he was provided supplies to change at home until MD f/u    TODAY'S TREATMENT:  09/11/24 Pendulums 3 way ( fwd/back, side to side, cw/ccw) 2x10min Seated PROM table slide R shoulder flexion (very small motion) Seated scapular protract/retract x 20 Seated shoulder shrugs x 20 Seated scap retraction x 20 Seated L shoulder isometrics ER 5x5, ABD 5x5, ADD 5x5 Supine shoulder AA flexion with wand x 10 Supine R shoulder ER 10x5 with wand -  Supine L shoulder PROM flex, scap, ER   Vasopneumatic ice to L shoulder at min compression x 34 deg 10 min  09/11/24 Pendulums 3 way ( fwd/back, side to side, cw/ccw) x10 each Supine R  shoulder PROM flex,scap, ER, elbow flex + ext, supination/pronation to tolerance Seated R scapular shrugs, depression, protraction/retraction, ccw/cw x 10 each Education provided on post op precautions, home instruction provided for sling use and HEP  09/06/24 SELF CARE: Provided education on PT POC progression, on post-surgical precautions, and on pain management options.; initial HEP  MODALITIES: Vasopneumatic ice to L shoulder at min compression x 34 deg  PATIENT EDUCATION:  Education details: HEP updates- wand exercises and table slide Person educated: Patient Education method: Explanation, Demonstration, Verbal cues, Tactile cues, Handouts, and MedBridge[employer] app access provided Education comprehension: verbalized understanding, returned demonstration, verbal cues required, tactile cues  required, and needs further education  HOME EXERCISE PROGRAM: Access Code: [email or web address] URL: [email or web address] Date: 09/20/2024 Prepared by: Corlette Ciano  Exercises - Seated Shoulder Shrugs  - 1 x daily - 7 x weekly - 2 sets - 10 reps - Seated Shoulder Rolls  - 1 x daily - 7 x weekly - 2 sets - 10 reps - Ball Squeeze With Shoulder Sling  - 1 x daily - 7 x weekly - 3 sets - 10 reps - Wrist Flexion and Extension With Shoulder Sling  - 1 x daily - 7 x weekly - 3 sets - 10 reps - Supported Elbow Flexion Extension PROM  - 1 x daily - 7 x weekly - 1 sets - 10 reps - Seated Shoulder Flexion Slide at Table Top with Forearm in Neutral  - 1 x daily - 7 x weekly - 3 sets - 10 reps - Supine Shoulder Flexion Extension AAROM with Dowel  - 1 x daily - 7 x weekly - 3 sets - 10 reps - Supine Shoulder External Rotation in 45 Degrees Abduction AAROM with Dowel  - 1 x daily - 7 x weekly - 3 sets - 10 reps   ASSESSMENT:  CLINICAL IMPRESSION: Pt arrives with no increased pain in his L shoulder. He had a good report from his doctor, he is ok to start weaning from the sling a little more at this point per patient. He is limited with L shoulder ER mostly and shows some improvements with flexion and scaption PROM. He was able to complete the interventions as we worked on scapular and GH AAROM and PROM. Vaso post session to address pain.  William Howe is a 34 y.o. male who was referred to physical therapy for evaluation and treatment for L shoulder diagnostic arthroscopy and bicep tenodesis.   His MRI in 8/25 showed bicep tenosynovitis and he failed all cortisone injection trials as well as PT.   HE is 2 days post op and has already f/u with ortho in office yesterday.  His operative note also indicates that he also had diffuse subacromial bursitis which was debrided.   Otherwise his RTC, GH joint, labrum all appeared normal.    He is in a sling that is to be worn at all times. Pain is moderate in the L  shoulder, but controlled with meds.  He has tegarderm bandages in place.   There is no drainage, no redness.   Edema is moderate. He is provided with initial HEP today for elbow PROM, wrist/hand/scapular AROM.  HE can teach back all shoulder precautions including keeping his L arm propped on a pillow when reclined/supine to avoid any shoulder extension.   He knows not to actively load his biceps.    Patient has deficits in L shoulder ROM, L shoulder flexibility, LUE strength, ADL/work abilities; and pain which are interfering with  ADLs and are impacting quality of life.  On QuickDASH patient scored 75/100 demonstrating 75% disability.  William Howe will benefit from skilled PT to address above deficits to improve mobility and activity tolerance with decreased pain interference.   OBJECTIVE IMPAIRMENTS: decreased ROM, decreased strength, impaired UE functional use, and pain.   ACTIVITY LIMITATIONS: carrying, lifting, sleeping, reach over head, and hygiene/grooming  PARTICIPATION LIMITATIONS: meal prep, cleaning, laundry, driving, shopping, community activity, and occupation  PERSONAL FACTORS: no personal factors are also affecting patient's functional outcome.   REHAB POTENTIAL: Good  CLINICAL DECISION MAKING: Evolving/moderate complexity  EVALUATION COMPLEXITY: Moderate   GOALS: Goals reviewed with patient? Yes  SHORT TERM GOALS: Target date: 10/04/2024   Patient will be independent with initial HEP to improve outcomes and carryover.  Baseline: 100% PT assist required for correct completion Goal status: INITIAL  2.  Patient will report 25% improvement in L shoulder pain to improve QOL.   Baseline: 8/10 worst Goal status: INITIAL  3.  Patient will demonstrate full L shoulder PROM to be able to progress to phase II on bicep tenodesis protocol Baseline: see tables above Goal status: IN PROGRESS- 09/20/24 progressing, see table above  LONG TERM GOALS: Target date: 11/01/2024   Patient will  be independent with ongoing/advanced HEP for self-management at home.  Baseline: no advanced HEP yet Goal status: INITIAL  2.  Patient will report 50-75% improvement in L shoulder pain to improve QOL.  Baseline: 8/10 Goal status: INITIAL  3.  Patient to demonstrate improved upright posture with posterior shoulder girdle engaged to promote improved glenohumeral joint mobility. Baseline: forward head/rounded shoulders/winged scapula Goal status: INITIAL  4.  Patient to improve L shoulder AROM to WNL without pain provocation to allow for increased ease of ADLs.  Baseline: Refer to above UE ROM table Goal status: INITIAL  5.  Patient will demonstrate improved LUE strength to >/= 5/5 for functional UE use. Baseline: Refer to above UE MMT table Goal status: INITIAL  6  Patient will report </= 30% on QuickDASH (MCID = 14%) to demonstrate improved functional ability.  Baseline: 75% Goal status: INITIAL  7.  Patient will be able to lift 10 lbs overhead to return to work   Baseline: NT due to surgical precautions Goal status: INITIAL    PLAN:  PT FREQUENCY: 1-2x/week  PT DURATION: 8 weeks  PLANNED INTERVENTIONS: 97110-Therapeutic exercises, 97530- Therapeutic activity, 97112- Neuromuscular re-education, 97535- Self Care, 02859- Manual therapy, G0283- Electrical stimulation (unattended), 97016- Vasopneumatic device, 20560 (1-2 muscles), 20561 (3+ muscles)- Dry Needling, Patient/Family education, Taping, Joint mobilization, Cryotherapy, and Moist heat  PLAN FOR NEXT SESSION: Continue with L shoulder PROM per protocol 2 weeks out as of 09/18/24; avoiding any shoulder extension; elbow PROM --no bicep loading;  progress per protocol   Sol LITTIE Gaskins, PTA 09/20/2024, 5:31 PM  "

## 2024-09-24 ENCOUNTER — Ambulatory Visit: Admitting: Rehabilitation

## 2024-09-27 ENCOUNTER — Ambulatory Visit: Admitting: Rehabilitation

## 2024-09-27 ENCOUNTER — Encounter: Payer: Self-pay | Admitting: Rehabilitation

## 2024-09-27 DIAGNOSIS — M25612 Stiffness of left shoulder, not elsewhere classified: Secondary | ICD-10-CM

## 2024-09-27 DIAGNOSIS — G8929 Other chronic pain: Secondary | ICD-10-CM

## 2024-09-27 DIAGNOSIS — M25512 Pain in left shoulder: Secondary | ICD-10-CM

## 2024-09-27 DIAGNOSIS — M6281 Muscle weakness (generalized): Secondary | ICD-10-CM

## 2024-09-27 NOTE — Therapy (Signed)
 " OUTPATIENT PHYSICAL THERAPY SHOULDER TREATMENT   Patient Name: William Howe MRN: [id number] DOB:16-May-1991, 34 y.o., male Today's Date: 09/27/2024   END OF SESSION:  PT End of Session - 09/27/24 1528     Visit Number 4    Date for Recertification  11/01/24    Authorization Type [employer]    PT Start Time 1526    PT Stop Time 1630    PT Time Calculation (min) 64 min    Activity Tolerance Patient tolerated treatment well    Behavior During Therapy [facility] for tasks assessed/performed             History reviewed. No pertinent past medical history. Past Surgical History:  Procedure Laterality Date   HAND SURGERY     TOE SURGERY     There are no active problems to display for this patient.   PCP: Jason Leita Repine, FNP (Inactive)   REFERRING PROVIDER: Case, Jordan, MD   REFERRING DIAG: Biceps tendinopathy, left (F32.077)  THERAPY DIAG:  Acute pain of left shoulder  Muscle weakness (generalized)  Stiffness of left shoulder, not elsewhere classified  Chronic left shoulder pain  RATIONALE FOR EVALUATION AND TREATMENT: Rehabilitation  ONSET DATE: Surgery 09/04/24  NEXT MD VISIT: 10/17/24   SUBJECTIVE:                                                                                                                                                                                                         SUBJECTIVE STATEMENT: Patient states feeling ok.   Rates L shoulder pain 1-2/10.   Worst over last week is 4/10.     EVAL:  34 y/o patient referred to PT from Dr Case following a diagnostic L shoulder arthroscopy with bicep tenodesis.   William Howe is well known to us  from recent episodes of PT for L neck pain and L shoulder pain.  Unfortunately his L shoulder pain failed to improve with PT.   He had multiple steroid injections that did not provide any relief or reveal a clear source of his pain.  His surgery revealed extensive synovial adhesion and scarring between the  rotator cuff and the biceps tendon.   He also had diffuse subacromial bursitis which was debrided from the subacromial periosteum.   He is 2 days post op and has already f/u with Dr Case in the office yesterday.  He is to wear his sling at all times to his knowledge except for therapy and home exercise.  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  He hopes to return to work after his recovery, but is not able to work right now due to surgical restrictions.  He is normally independent with all activities, ADLs, etc.   He enjoys playing video games in his free time, but is advised that he should not be using the LUE for gaming right now.   He is to speak with his surgeon regarding when it might be ok to resume gaming activities.    We will be following the [place] Bicep tenodesis protocol which is available online.     PAIN: Are you having pain? Yes: NPRS scale: 1-2/10 now;  8/10 worst Pain location: L shoulder Pain description: aching constantly;  sharper at times Aggravating factors: moving the R arm Relieving factors: rest, pain meds  PERTINENT HISTORY:  unremarkable  PRECAUTIONS: Shoulder bicep tenodesis protocol precautions--patient is given a copy of the [place] protocol today with all restrictions noted---Reviewed with patient on today's visit:  No lifting, no carrying, No WB, No pushing/pulling, Sling all times;  PROM only of the R shoulder; no AROM R shoulder; Avoid lying on R side;  sleep in recliner PRN;  support LUE in supine positions with pillow under the arm to avoid extension past plane of the body;  no load on the L biceps  RED FLAGS: None  HAND DOMINANCE: Right  WEIGHT BEARING RESTRICTIONS: Yes NWB LUE  FALLS:  Has patient fallen in last 6 months? No  LIVING ENVIRONMENT: Lives with: lives with their family Lives in: House/apartment Stairs: N/A Has following  equipment at home: None  OCCUPATION: production designer, theatre/television/film at [employer]  PLOF: Independent with gait  PATIENT GOALS: return to work painfree   OBJECTIVE: (objective measures completed at initial evaluation unless otherwise dated)  DIAGNOSTIC FINDINGS:   EXAM: MRI CERVICAL SPINE WITHOUT CONTRAST   TECHNIQUE: Multiplanar, multisequence MR imaging of the cervical spine was performed. No intravenous contrast was administered.   COMPARISON:  None Available.   FINDINGS: Alignment: Normal.   Vertebrae: No fracture, evidence of discitis, or bone lesion.   Cord: Normal signal throughout.   Posterior Fossa, vertebral arteries, paraspinal tissues: Negative.   Disc levels:   The central canal and foramina are widely patent at all levels. Disc height and hydration are maintained throughout. Minimal disc bulging C5-6 and C6-7 is noted.   IMPRESSION: Minimal disc bulging C5-6 and C6-7. The exam is otherwise negative. The central canal and foramina are widely patent at all levels.     Electronically Signed   By: Debby Prader M.D.   On: 12/06/2023 09:02  PATIENT SURVEYS:  Quick Dash:  QUICK DASH  Please rate your ability do the following activities in the last week by selecting the number below the appropriate response.   Activities Rating  Open a tight or new jar.  5 = Unable  Do heavy household chores (e.g., wash walls, floors). 5 = Unable  Carry a shopping bag or briefcase 5 = Unable  Wash your back. 5 = Unable  Use a knife to cut food. 5 = Unable  Recreational activities in which you take some force or impact through your arm, shoulder or hand (e.g., golf, hammering, tennis, etc.). 5 = Unable  During the past week, to what extent has your arm, shoulder or hand problem interfered with your normal social activities with family, friends, neighbors or groups?  2 = Slightly  During the past  week, were you limited in your work or other regular daily activities as a result of  your arm, shoulder or hand problem? 5 = Unable  Rate the severity of the following symptoms in the last week: Arm, Shoulder, or hand pain. 3 = Moderate  Rate the severity of the following symptoms in the last week: Tingling (pins and needles) in your arm, shoulder or hand. 1 = none  During the past week, how much difficulty have you had sleeping because of the pain in your arm, shoulder or hand?  3 = Moderate difficulty   (A QuickDASH score may not be calculated if there is greater than 1 missing item.)  Quick Dash Disability/Symptom Score: [(sum of 44 (n) responses/11 (n)] x 25 = 75  Minimally Clinically Important Difference (MCID): 15-20 points  (Franchignoni, F. et al. (2013). Minimally clinically important difference of the disabilities of the arm, shoulder, and hand outcome measures (DASH) and its shortened version (Quick DASH). Journal of Orthopaedic & Sports Physical Therapy, 44(1), 30-39)   COGNITION: Overall cognitive status: Within functional limits for tasks assessed     SENSATION: WFL  POSTURE: rounded shoulders, forward head, and some scapular winging  UPPER EXTREMITY ROM:   Passive ROM Right eval Left eval L PROM 09/20/24 LUE P/AAROM 09/26/24  Shoulder flexion 180 90 100 145  Shoulder extension 60+ NT (precautions)    Shoulder abduction 180 70 Scaption- 105 100  Shoulder adduction Behind opposite shoulder NT     Shoulder internal rotation 90+ 40 at 70 deg scaption    Shoulder external rotation 90+ 20 (restricted to no more than 40 deg per protocol initially) Unable to get to neutral 0 deg at neutral 40 deg at 45 deg scaption/ABD  Elbow flexion 140 125  134  Elbow extension 0 -20  0  Wrist flexion      Wrist extension      Wrist ulnar deviation      Wrist radial deviation      Wrist pronation      Wrist supination      (Blank rows = not tested)  UPPER EXTREMITY MMT:   not tested on eval due to surgical restrictions  MMT Right eval Left eval  Shoulder flexion     Shoulder extension    Shoulder abduction    Shoulder adduction    Shoulder internal rotation    Shoulder external rotation    Middle trapezius    Lower trapezius    Elbow flexion    Elbow extension    Wrist flexion    Wrist extension    Wrist ulnar deviation    Wrist radial deviation    Wrist pronation    Wrist supination    Grip strength (lbs)    (Blank rows = not tested)  SHOULDER SPECIAL TESTS:  N/A  JOINT MOBILITY TESTING:  NT today, but patient is generally hypermobile  PALPATION:  TTP around the L shoulder in general.   He has telfa/tegaderm dressings over his scope sites that he was provided supplies to change at home until MD f/u    TODAY'S TREATMENT:   approved for 16 visits/64 units 09/27/24:  4/16 visits used 3 weeks post op this week  THERAPEUTIC EXERCISE: To improve strength, endurance, and ROM.  Demonstration, verbal and tactile cues throughout for technique. Pendulums CW x 20;  CCW x 20 LUE L shoulder isometric extension, IR, and ABD w/ 5 sec holds at 25% effort x 10 reps PROM L shoulder in all  planes to 145 deg flexion/scaption, ER to 40 deg at 45 deg ABD  THERAPEUTIC ACTIVITIES: To improve functional performance.  Demonstration, verbal and tactile cues throughout for technique. Supine wand ER AAROM x 30 LUE Supine wand ER stretch x 1' x 3 LUE (advised to do this with arm elevated on pillows to 45 deg scaption/ABD at home) Supine Hands clasped shoulder press up x 10 LUE Supine Hands clasped shoulder flexion/scaption AAROM x 10 LUE Supine wand press up x 10 BUE AAROM Supine wand flexion x 10 BUE AAROM Supine elbow flexion/extension AAROM X 10 LUE Standing Counter dusting CW x 20; CCW x 20 using BUE AAROM Seated 75 cm swiss ball rollouts BUE AAROM x 20  MANUAL THERAPY: To promote reduced pain utilizing joint mobilization. Gentle grade 2 joint oscillation/mobilizations L shoulder in all directions holding the L shoulder in 45 deg scaption x 20-30  reps  CP X 10' L shoulder  09/11/24 Pendulums 3 way ( fwd/back, side to side, cw/ccw) 2x77min Seated PROM table slide R shoulder flexion (very small motion) Seated scapular protract/retract x 20 Seated shoulder shrugs x 20 Seated scap retraction x 20 Seated L shoulder isometrics ER 5x5, ABD 5x5, ADD 5x5 Supine shoulder AA flexion with wand x 10 Supine R shoulder ER 10x5 with wand -  Supine L shoulder PROM flex, scap, ER   Vasopneumatic ice to L shoulder at min compression x 34 deg 10 min  09/11/24 Pendulums 3 way ( fwd/back, side to side, cw/ccw) x10 each Supine R shoulder PROM flex,scap, ER, elbow flex + ext, supination/pronation to tolerance Seated R scapular shrugs, depression, protraction/retraction, ccw/cw x 10 each Education provided on post op precautions, home instruction provided for sling use and HEP  09/06/24 SELF CARE: Provided education on PT POC progression, on post-surgical precautions, and on pain management options.; initial HEP  MODALITIES: Vasopneumatic ice to L shoulder at min compression x 34 deg  PATIENT EDUCATION:  Education details: HEP updates- wand exercises and table slide Person educated: Patient Education method: Explanation, Demonstration, Verbal cues, Tactile cues, Handouts, and MedBridge[employer] app access provided Education comprehension: verbalized understanding, returned demonstration, verbal cues required, tactile cues required, and needs further education  HOME EXERCISE PROGRAM: Access Code: [email or web address] URL: [email or web address] Date: 09/20/2024 Prepared by: Braylin Clark  Exercises - Seated Shoulder Shrugs  - 1 x daily - 7 x weekly - 2 sets - 10 reps - Seated Shoulder Rolls  - 1 x daily - 7 x weekly - 2 sets - 10 reps - Ball Squeeze With Shoulder Sling  - 1 x daily - 7 x weekly - 3 sets - 10 reps - Wrist Flexion and Extension With Shoulder Sling  - 1 x daily - 7 x weekly - 3 sets - 10 reps - Supported Elbow Flexion Extension  PROM  - 1 x daily - 7 x weekly - 1 sets - 10 reps - Seated Shoulder Flexion Slide at Table Top with Forearm in Neutral  - 1 x daily - 7 x weekly - 3 sets - 10 reps - Supine Shoulder Flexion Extension AAROM with Dowel  - 1 x daily - 7 x weekly - 3 sets - 10 reps - Supine Shoulder External Rotation in 45 Degrees Abduction AAROM with Dowel  - 1 x daily - 7 x weekly - 3 sets - 10 reps   ASSESSMENT:  CLINICAL IMPRESSION: Patient has a lot of difficulty with external rotation with the arm at neutral.   However, he does better with  ER in 45-50 deg scaption/ABD and is advised to work on this range at home with his wand ER.   He is able to progress with AAROM of the L shoulder today with good tolerance.    William Howe is a 34 y.o. male who was referred to physical therapy for evaluation and treatment for L shoulder diagnostic arthroscopy and bicep tenodesis.   His MRI in 8/25 showed bicep tenosynovitis and he failed all cortisone injection trials as well as PT.   HE is 2 days post op and has already f/u with ortho in office yesterday.  His operative note also indicates that he also had diffuse subacromial bursitis which was debrided.   Otherwise his RTC, GH joint, labrum all appeared normal.    He is in a sling that is to be worn at all times. Pain is moderate in the L shoulder, but controlled with meds.  He has tegarderm bandages in place.   There is no drainage, no redness.   Edema is moderate. He is provided with initial HEP today for elbow PROM, wrist/hand/scapular AROM.  HE can teach back all shoulder precautions including keeping his L arm propped on a pillow when reclined/supine to avoid any shoulder extension.   He knows not to actively load his biceps.    Patient has deficits in L shoulder ROM, L shoulder flexibility, LUE strength, ADL/work abilities; and pain which are interfering with ADLs and are impacting quality of life.  On QuickDASH patient scored 75/100 demonstrating 75% disability.  William Howe  will benefit from skilled PT to address above deficits to improve mobility and activity tolerance with decreased pain interference.   OBJECTIVE IMPAIRMENTS: decreased ROM, decreased strength, impaired UE functional use, and pain.   ACTIVITY LIMITATIONS: carrying, lifting, sleeping, reach over head, and hygiene/grooming  PARTICIPATION LIMITATIONS: meal prep, cleaning, laundry, driving, shopping, community activity, and occupation  PERSONAL FACTORS: no personal factors are also affecting patient's functional outcome.   REHAB POTENTIAL: Good  CLINICAL DECISION MAKING: Evolving/moderate complexity  EVALUATION COMPLEXITY: Moderate   GOALS: Goals reviewed with patient? Yes  SHORT TERM GOALS: Target date: 10/04/2024   Patient will be independent with initial HEP to improve outcomes and carryover.  Baseline: 100% PT assist required for correct completion 09/27/24:  reviewed and patient can teach back correctly Goal status: IN PROGRESS  2.  Patient will report 25% improvement in L shoulder pain to improve QOL.   Baseline: 8/10 worst 09/27/24:  5/10 worst Goal status: INITIAL  3.  Patient will demonstrate full L shoulder PROM to be able to progress to phase II on bicep tenodesis protocol Baseline: see tables above Goal status: IN PROGRESS- 09/20/24 progressing, see table above  LONG TERM GOALS: Target date: 11/01/2024   Patient will be independent with ongoing/advanced HEP for self-management at home.  Baseline: no advanced HEP yet Goal status: INITIAL  2.  Patient will report 50-75% improvement in L shoulder pain to improve QOL.  Baseline: 8/10 Goal status: INITIAL  3.  Patient to demonstrate improved upright posture with posterior shoulder girdle engaged to promote improved glenohumeral joint mobility. Baseline: forward head/rounded shoulders/winged scapula Goal status: INITIAL  4.  Patient to improve L shoulder AROM to WNL without pain provocation to allow for increased ease of  ADLs.  Baseline: Refer to above UE ROM table Goal status: INITIAL  5.  Patient will demonstrate improved LUE strength to >/= 5/5 for functional UE use. Baseline: Refer to above UE MMT table Goal status: INITIAL  6  Patient will report </= 30% on QuickDASH (MCID = 14%) to demonstrate improved functional ability.  Baseline: 75% Goal status: INITIAL  7.  Patient will be able to lift 10 lbs overhead to return to work   Baseline: NT due to surgical precautions Goal status: INITIAL    PLAN:  PT FREQUENCY: 1-2x/week  PT DURATION: 8 weeks  PLANNED INTERVENTIONS: 97110-Therapeutic exercises, 97530- Therapeutic activity, [id number]- Neuromuscular re-education, 97535- Self Care, [id number]- Manual therapy, [id number]- Electrical stimulation (unattended), 97016- Vasopneumatic device, 20560 (1-2 muscles), 20561 (3+ muscles)- Dry Needling, Patient/Family education, Taping, Joint mobilization, Cryotherapy, and Moist heat  PLAN FOR NEXT SESSION: Continue with L shoulder PROM per protocol 3 weeks out as of 09/25/24; avoiding any shoulder extension; elbow AAROM --no bicep loading;  progress per protocol   Sharron Simpson, PT 09/27/2024, 4:44 PM  "

## 2024-10-01 ENCOUNTER — Ambulatory Visit: Admitting: Rehabilitation

## 2024-10-04 ENCOUNTER — Ambulatory Visit: Admitting: Rehabilitation

## 2024-10-08 ENCOUNTER — Ambulatory Visit

## 2024-10-11 ENCOUNTER — Ambulatory Visit

## 2024-10-15 ENCOUNTER — Ambulatory Visit

## 2024-10-17 ENCOUNTER — Ambulatory Visit

## 2024-10-22 ENCOUNTER — Ambulatory Visit: Admitting: Rehabilitation

## 2024-10-25 ENCOUNTER — Ambulatory Visit

## 2024-10-29 ENCOUNTER — Ambulatory Visit: Admitting: Rehabilitation

## 2024-11-01 ENCOUNTER — Ambulatory Visit
